# Patient Record
Sex: Male | Born: 1960 | Race: White | Hispanic: No | Smoking: Current every day smoker
Health system: Southern US, Community
[De-identification: ages and names within clinical notes are randomized; demographics above are authoritative.]

## PROBLEM LIST (undated history)

## (undated) DIAGNOSIS — I420 Dilated cardiomyopathy: Secondary | ICD-10-CM

## (undated) DIAGNOSIS — I10 Essential (primary) hypertension: Secondary | ICD-10-CM

## (undated) DIAGNOSIS — I5033 Acute on chronic diastolic (congestive) heart failure: Secondary | ICD-10-CM

## (undated) DIAGNOSIS — I472 Ventricular tachycardia, unspecified: Secondary | ICD-10-CM

## (undated) DIAGNOSIS — I4891 Unspecified atrial fibrillation: Principal | ICD-10-CM

## (undated) HISTORY — PX: PILONIDAL CYST / SINUS EXCISION: SUR543

---

## 2003-07-05 HISTORY — PX: CARDIAC CATHETERIZATION: SHX172

## 2004-02-18 ENCOUNTER — Inpatient Hospital Stay (HOSPITAL_COMMUNITY): Admission: EM | Admit: 2004-02-18 | Discharge: 2004-03-02 | Payer: Self-pay | Admitting: Emergency Medicine

## 2004-02-19 ENCOUNTER — Encounter: Payer: Self-pay | Admitting: Internal Medicine

## 2004-02-23 ENCOUNTER — Encounter: Payer: Self-pay | Admitting: Internal Medicine

## 2004-05-28 ENCOUNTER — Ambulatory Visit: Payer: Self-pay | Admitting: Internal Medicine

## 2004-06-03 ENCOUNTER — Ambulatory Visit: Payer: Self-pay | Admitting: Cardiology

## 2004-06-04 ENCOUNTER — Ambulatory Visit: Payer: Self-pay | Admitting: Cardiology

## 2004-06-14 ENCOUNTER — Ambulatory Visit: Payer: Self-pay

## 2004-06-14 ENCOUNTER — Ambulatory Visit: Payer: Self-pay | Admitting: Cardiology

## 2005-03-15 ENCOUNTER — Inpatient Hospital Stay (HOSPITAL_COMMUNITY): Admission: EM | Admit: 2005-03-15 | Discharge: 2005-03-21 | Payer: Self-pay | Admitting: *Deleted

## 2005-03-15 ENCOUNTER — Ambulatory Visit: Payer: Self-pay | Admitting: Cardiology

## 2005-03-16 ENCOUNTER — Encounter: Payer: Self-pay | Admitting: Cardiology

## 2005-03-16 ENCOUNTER — Ambulatory Visit: Payer: Self-pay | Admitting: Cardiology

## 2005-03-24 ENCOUNTER — Ambulatory Visit: Payer: Self-pay | Admitting: Cardiology

## 2005-04-07 ENCOUNTER — Ambulatory Visit: Payer: Self-pay | Admitting: Cardiology

## 2005-04-08 ENCOUNTER — Ambulatory Visit: Payer: Self-pay | Admitting: Internal Medicine

## 2005-05-02 ENCOUNTER — Ambulatory Visit: Payer: Self-pay | Admitting: Cardiology

## 2005-05-17 ENCOUNTER — Ambulatory Visit: Payer: Self-pay | Admitting: Internal Medicine

## 2005-06-07 ENCOUNTER — Ambulatory Visit: Payer: Self-pay | Admitting: Cardiology

## 2005-06-28 ENCOUNTER — Ambulatory Visit: Payer: Self-pay | Admitting: Internal Medicine

## 2005-09-12 ENCOUNTER — Ambulatory Visit: Payer: Self-pay | Admitting: Cardiology

## 2005-09-20 ENCOUNTER — Ambulatory Visit: Payer: Self-pay | Admitting: Internal Medicine

## 2006-01-16 ENCOUNTER — Ambulatory Visit: Payer: Self-pay | Admitting: Cardiology

## 2006-01-23 ENCOUNTER — Ambulatory Visit: Payer: Self-pay | Admitting: Family Medicine

## 2006-01-30 ENCOUNTER — Ambulatory Visit: Payer: Self-pay | Admitting: Cardiovascular Disease

## 2006-02-13 ENCOUNTER — Ambulatory Visit: Payer: Self-pay | Admitting: Cardiovascular Disease

## 2006-05-05 ENCOUNTER — Ambulatory Visit: Payer: Self-pay | Admitting: Cardiology

## 2007-01-26 ENCOUNTER — Telehealth (INDEPENDENT_AMBULATORY_CARE_PROVIDER_SITE_OTHER): Payer: Self-pay | Admitting: *Deleted

## 2007-01-26 ENCOUNTER — Telehealth (INDEPENDENT_AMBULATORY_CARE_PROVIDER_SITE_OTHER): Payer: Self-pay | Admitting: Family Medicine

## 2007-01-26 ENCOUNTER — Ambulatory Visit: Payer: Self-pay | Admitting: Family Medicine

## 2007-01-26 DIAGNOSIS — I4891 Unspecified atrial fibrillation: Secondary | ICD-10-CM | POA: Insufficient documentation

## 2007-01-26 DIAGNOSIS — I509 Heart failure, unspecified: Secondary | ICD-10-CM | POA: Insufficient documentation

## 2007-01-26 LAB — CONVERTED CEMR LAB
Basophils Absolute: 0 10*3/uL (ref 0.0–0.1)
Basophils Relative: 0 % (ref 0–1)
Chloride: 99 meq/L (ref 96–112)
Eosinophils Absolute: 0.2 10*3/uL (ref 0.0–0.7)
Eosinophils Relative: 2 % (ref 0–5)
Hemoglobin: 13.2 g/dL (ref 13.0–17.0)
Lymphocytes Relative: 20 % (ref 12–46)
MCHC: 33.2 g/dL (ref 30.0–36.0)
MCV: 90.7 fL (ref 78.0–100.0)
Monocytes Absolute: 0.6 10*3/uL (ref 0.2–0.7)
Neutro Abs: 6 10*3/uL (ref 1.7–7.7)
Neutrophils Relative %: 70 % (ref 43–77)
WBC: 8.6 10*3/uL (ref 4.0–10.5)

## 2007-01-29 ENCOUNTER — Telehealth (INDEPENDENT_AMBULATORY_CARE_PROVIDER_SITE_OTHER): Payer: Self-pay | Admitting: *Deleted

## 2007-02-01 ENCOUNTER — Telehealth (INDEPENDENT_AMBULATORY_CARE_PROVIDER_SITE_OTHER): Payer: Self-pay | Admitting: *Deleted

## 2007-02-02 ENCOUNTER — Telehealth (INDEPENDENT_AMBULATORY_CARE_PROVIDER_SITE_OTHER): Payer: Self-pay | Admitting: *Deleted

## 2007-02-16 ENCOUNTER — Ambulatory Visit: Payer: Self-pay | Admitting: Family Medicine

## 2007-02-19 ENCOUNTER — Encounter (INDEPENDENT_AMBULATORY_CARE_PROVIDER_SITE_OTHER): Payer: Self-pay | Admitting: Family Medicine

## 2007-02-19 ENCOUNTER — Telehealth (INDEPENDENT_AMBULATORY_CARE_PROVIDER_SITE_OTHER): Payer: Self-pay | Admitting: *Deleted

## 2007-02-19 LAB — CONVERTED CEMR LAB
CO2: 31 meq/L (ref 19–32)
Calcium: 8.8 mg/dL (ref 8.4–10.5)
Creatinine, Ser: 0.8 mg/dL (ref 0.4–1.5)
GFR calc Af Amer: 133 mL/min
GFR calc non Af Amer: 110 mL/min
Glucose, Bld: 111 mg/dL — ABNORMAL HIGH (ref 70–99)
Potassium: 4.6 meq/L (ref 3.5–5.1)
Sodium: 143 meq/L (ref 135–145)

## 2007-02-26 ENCOUNTER — Telehealth (INDEPENDENT_AMBULATORY_CARE_PROVIDER_SITE_OTHER): Payer: Self-pay | Admitting: *Deleted

## 2007-02-26 ENCOUNTER — Encounter (INDEPENDENT_AMBULATORY_CARE_PROVIDER_SITE_OTHER): Payer: Self-pay | Admitting: *Deleted

## 2007-03-07 ENCOUNTER — Encounter (INDEPENDENT_AMBULATORY_CARE_PROVIDER_SITE_OTHER): Payer: Self-pay | Admitting: Family Medicine

## 2007-03-15 ENCOUNTER — Encounter (INDEPENDENT_AMBULATORY_CARE_PROVIDER_SITE_OTHER): Payer: Self-pay | Admitting: Family Medicine

## 2007-03-28 ENCOUNTER — Ambulatory Visit: Payer: Self-pay | Admitting: Family Medicine

## 2007-03-28 DIAGNOSIS — M549 Dorsalgia, unspecified: Secondary | ICD-10-CM | POA: Insufficient documentation

## 2007-03-30 ENCOUNTER — Telehealth (INDEPENDENT_AMBULATORY_CARE_PROVIDER_SITE_OTHER): Payer: Self-pay | Admitting: *Deleted

## 2007-03-30 ENCOUNTER — Encounter (INDEPENDENT_AMBULATORY_CARE_PROVIDER_SITE_OTHER): Payer: Self-pay | Admitting: Family Medicine

## 2007-05-25 ENCOUNTER — Telehealth (INDEPENDENT_AMBULATORY_CARE_PROVIDER_SITE_OTHER): Payer: Self-pay | Admitting: *Deleted

## 2007-05-27 ENCOUNTER — Encounter (INDEPENDENT_AMBULATORY_CARE_PROVIDER_SITE_OTHER): Payer: Self-pay | Admitting: Family Medicine

## 2007-09-04 ENCOUNTER — Ambulatory Visit: Payer: Self-pay | Admitting: Internal Medicine

## 2007-09-04 DIAGNOSIS — I10 Essential (primary) hypertension: Secondary | ICD-10-CM | POA: Insufficient documentation

## 2007-09-04 DIAGNOSIS — Z8679 Personal history of other diseases of the circulatory system: Secondary | ICD-10-CM

## 2007-09-04 DIAGNOSIS — Z85828 Personal history of other malignant neoplasm of skin: Secondary | ICD-10-CM | POA: Insufficient documentation

## 2007-09-04 DIAGNOSIS — E8881 Metabolic syndrome: Secondary | ICD-10-CM | POA: Insufficient documentation

## 2007-09-06 ENCOUNTER — Encounter (INDEPENDENT_AMBULATORY_CARE_PROVIDER_SITE_OTHER): Payer: Self-pay | Admitting: *Deleted

## 2007-09-06 LAB — CONVERTED CEMR LAB
Eosinophils Relative: 5.5 % — ABNORMAL HIGH (ref 0.0–5.0)
HCT: 40.7 % (ref 39.0–52.0)
Hgb A1c MFr Bld: 6 % (ref 4.6–6.0)
Monocytes Relative: 9.1 % (ref 3.0–11.0)
RDW: 13.2 % (ref 11.5–14.6)
WBC: 8 10*3/uL (ref 4.5–10.5)

## 2007-09-18 ENCOUNTER — Telehealth (INDEPENDENT_AMBULATORY_CARE_PROVIDER_SITE_OTHER): Payer: Self-pay | Admitting: *Deleted

## 2007-10-01 ENCOUNTER — Inpatient Hospital Stay (HOSPITAL_COMMUNITY): Admission: EM | Admit: 2007-10-01 | Discharge: 2007-10-05 | Payer: Self-pay | Admitting: Emergency Medicine

## 2007-10-01 ENCOUNTER — Ambulatory Visit: Payer: Self-pay | Admitting: Cardiovascular Disease

## 2007-10-02 ENCOUNTER — Encounter: Payer: Self-pay | Admitting: Cardiovascular Disease

## 2007-10-08 ENCOUNTER — Ambulatory Visit: Payer: Self-pay | Admitting: Cardiology

## 2007-10-08 ENCOUNTER — Ambulatory Visit: Payer: Self-pay | Admitting: Cardiovascular Disease

## 2007-10-08 LAB — CONVERTED CEMR LAB
Calcium: 9.5 mg/dL (ref 8.4–10.5)
Chloride: 92 meq/L — ABNORMAL LOW (ref 96–112)
GFR calc Af Amer: 116 mL/min
GFR calc non Af Amer: 96 mL/min
Glucose, Bld: 140 mg/dL — ABNORMAL HIGH (ref 70–99)
INR: 4.5 — ABNORMAL HIGH (ref 0.8–1.0)
Potassium: 4.3 meq/L (ref 3.5–5.1)
Prothrombin Time: 44.9 s — ABNORMAL HIGH (ref 10.9–13.3)
Sodium: 133 meq/L — ABNORMAL LOW (ref 135–145)

## 2007-10-17 ENCOUNTER — Ambulatory Visit: Payer: Self-pay | Admitting: Cardiology

## 2007-10-17 LAB — CONVERTED CEMR LAB: INR: 9.5 (ref 0.8–1.0)

## 2007-10-22 ENCOUNTER — Ambulatory Visit: Payer: Self-pay | Admitting: Cardiology

## 2007-11-05 ENCOUNTER — Ambulatory Visit: Payer: Self-pay | Admitting: Internal Medicine

## 2007-11-08 ENCOUNTER — Emergency Department (HOSPITAL_COMMUNITY): Admission: EM | Admit: 2007-11-08 | Discharge: 2007-11-08 | Payer: Self-pay | Admitting: Family Medicine

## 2007-11-09 ENCOUNTER — Emergency Department (HOSPITAL_COMMUNITY): Admission: EM | Admit: 2007-11-09 | Discharge: 2007-11-10 | Payer: Self-pay | Admitting: Emergency Medicine

## 2007-11-14 ENCOUNTER — Ambulatory Visit: Payer: Self-pay | Admitting: Internal Medicine

## 2007-11-14 DIAGNOSIS — J019 Acute sinusitis, unspecified: Secondary | ICD-10-CM

## 2007-11-16 ENCOUNTER — Telehealth (INDEPENDENT_AMBULATORY_CARE_PROVIDER_SITE_OTHER): Payer: Self-pay | Admitting: *Deleted

## 2007-11-19 ENCOUNTER — Emergency Department: Payer: Self-pay | Admitting: Emergency Medicine

## 2007-12-04 ENCOUNTER — Ambulatory Visit: Payer: Self-pay | Admitting: Cardiology

## 2007-12-10 ENCOUNTER — Emergency Department (HOSPITAL_COMMUNITY): Admission: EM | Admit: 2007-12-10 | Discharge: 2007-12-10 | Payer: Self-pay | Admitting: Emergency Medicine

## 2008-01-07 ENCOUNTER — Ambulatory Visit: Payer: Self-pay | Admitting: Cardiology

## 2008-01-23 ENCOUNTER — Ambulatory Visit: Payer: Self-pay | Admitting: Cardiology

## 2008-02-09 ENCOUNTER — Emergency Department (HOSPITAL_COMMUNITY): Admission: EM | Admit: 2008-02-09 | Discharge: 2008-02-09 | Payer: Self-pay | Admitting: Emergency Medicine

## 2008-02-13 ENCOUNTER — Ambulatory Visit: Payer: Self-pay | Admitting: Cardiology

## 2008-03-07 ENCOUNTER — Ambulatory Visit: Payer: Self-pay | Admitting: Cardiology

## 2008-03-07 ENCOUNTER — Ambulatory Visit: Payer: Self-pay | Admitting: Cardiovascular Disease

## 2008-03-07 LAB — CONVERTED CEMR LAB
BUN: 14 mg/dL (ref 6–23)
Calcium: 8.6 mg/dL (ref 8.4–10.5)
Chloride: 97 meq/L (ref 96–112)
Potassium: 4.1 meq/L (ref 3.5–5.1)

## 2008-03-19 ENCOUNTER — Emergency Department (HOSPITAL_COMMUNITY): Admission: EM | Admit: 2008-03-19 | Discharge: 2008-03-19 | Payer: Self-pay | Admitting: Family Medicine

## 2008-04-02 ENCOUNTER — Ambulatory Visit: Payer: Self-pay | Admitting: Cardiology

## 2008-04-09 ENCOUNTER — Ambulatory Visit: Payer: Self-pay | Admitting: Cardiology

## 2008-04-09 ENCOUNTER — Ambulatory Visit: Payer: Self-pay | Admitting: Internal Medicine

## 2008-04-09 LAB — CONVERTED CEMR LAB
Creatinine, Ser: 0.7 mg/dL (ref 0.4–1.5)
GFR calc Af Amer: 154 mL/min
Glucose, Bld: 175 mg/dL — ABNORMAL HIGH (ref 70–99)
Potassium: 4 meq/L (ref 3.5–5.1)

## 2008-05-07 ENCOUNTER — Ambulatory Visit: Payer: Self-pay | Admitting: Cardiology

## 2008-05-07 ENCOUNTER — Ambulatory Visit: Payer: Self-pay | Admitting: Cardiovascular Disease

## 2008-05-07 LAB — CONVERTED CEMR LAB
BUN: 19 mg/dL (ref 6–23)
CO2: 34 meq/L — ABNORMAL HIGH (ref 19–32)
Chloride: 99 meq/L (ref 96–112)
Creatinine, Ser: 1 mg/dL (ref 0.4–1.5)
GFR calc Af Amer: 102 mL/min
GFR calc non Af Amer: 84 mL/min

## 2008-05-21 ENCOUNTER — Ambulatory Visit: Payer: Self-pay | Admitting: Cardiology

## 2008-06-20 ENCOUNTER — Ambulatory Visit: Payer: Self-pay | Admitting: Cardiology

## 2008-07-06 ENCOUNTER — Emergency Department (HOSPITAL_COMMUNITY): Admission: EM | Admit: 2008-07-06 | Discharge: 2008-07-06 | Payer: Self-pay | Admitting: Emergency Medicine

## 2008-07-18 ENCOUNTER — Ambulatory Visit: Payer: Self-pay | Admitting: Cardiology

## 2008-07-18 LAB — CONVERTED CEMR LAB
INR: 5.4 — ABNORMAL HIGH
Prothrombin Time: 53.3 s

## 2008-08-04 ENCOUNTER — Emergency Department (HOSPITAL_COMMUNITY): Admission: EM | Admit: 2008-08-04 | Discharge: 2008-08-04 | Payer: Self-pay | Admitting: Emergency Medicine

## 2008-08-09 ENCOUNTER — Ambulatory Visit: Payer: Self-pay | Admitting: Internal Medicine

## 2008-08-09 ENCOUNTER — Inpatient Hospital Stay (HOSPITAL_COMMUNITY): Admission: EM | Admit: 2008-08-09 | Discharge: 2008-08-25 | Payer: Self-pay | Admitting: Emergency Medicine

## 2008-08-27 ENCOUNTER — Ambulatory Visit: Payer: Self-pay | Admitting: Cardiology

## 2008-09-03 ENCOUNTER — Ambulatory Visit: Payer: Self-pay | Admitting: Internal Medicine

## 2008-09-03 ENCOUNTER — Ambulatory Visit: Payer: Self-pay | Admitting: Cardiology

## 2008-09-08 ENCOUNTER — Emergency Department (HOSPITAL_COMMUNITY): Admission: EM | Admit: 2008-09-08 | Discharge: 2008-09-08 | Payer: Self-pay | Admitting: Family Medicine

## 2008-09-15 ENCOUNTER — Ambulatory Visit: Payer: Self-pay | Admitting: Cardiology

## 2008-09-15 LAB — CONVERTED CEMR LAB: Prothrombin Time: 92.9 s (ref 10.9–13.3)

## 2008-09-17 ENCOUNTER — Ambulatory Visit: Payer: Self-pay | Admitting: Cardiology

## 2008-09-17 LAB — CONVERTED CEMR LAB: Prothrombin Time: 60.9 s (ref 10.9–13.3)

## 2008-09-20 ENCOUNTER — Emergency Department (HOSPITAL_COMMUNITY): Admission: EM | Admit: 2008-09-20 | Discharge: 2008-09-20 | Payer: Self-pay | Admitting: Emergency Medicine

## 2008-09-22 ENCOUNTER — Ambulatory Visit: Payer: Self-pay | Admitting: Cardiology

## 2008-10-16 ENCOUNTER — Ambulatory Visit: Payer: Self-pay | Admitting: Pulmonary Disease

## 2008-10-16 DIAGNOSIS — G4710 Hypersomnia, unspecified: Secondary | ICD-10-CM | POA: Insufficient documentation

## 2008-10-16 DIAGNOSIS — G4730 Sleep apnea, unspecified: Secondary | ICD-10-CM

## 2008-10-17 ENCOUNTER — Ambulatory Visit: Payer: Self-pay | Admitting: Internal Medicine

## 2008-11-07 ENCOUNTER — Ambulatory Visit: Payer: Self-pay | Admitting: Cardiovascular Disease

## 2008-11-10 ENCOUNTER — Emergency Department (HOSPITAL_BASED_OUTPATIENT_CLINIC_OR_DEPARTMENT_OTHER): Admission: EM | Admit: 2008-11-10 | Discharge: 2008-11-10 | Payer: Self-pay | Admitting: Emergency Medicine

## 2008-11-11 ENCOUNTER — Ambulatory Visit (HOSPITAL_BASED_OUTPATIENT_CLINIC_OR_DEPARTMENT_OTHER): Admission: RE | Admit: 2008-11-11 | Discharge: 2008-11-11 | Payer: Self-pay | Admitting: Pulmonary Disease

## 2008-11-11 ENCOUNTER — Encounter: Payer: Self-pay | Admitting: Pulmonary Disease

## 2008-11-14 ENCOUNTER — Ambulatory Visit: Payer: Self-pay | Admitting: Pulmonary Disease

## 2008-11-26 ENCOUNTER — Encounter: Payer: Self-pay | Admitting: Pulmonary Disease

## 2008-12-03 ENCOUNTER — Encounter: Payer: Self-pay | Admitting: *Deleted

## 2008-12-08 ENCOUNTER — Ambulatory Visit: Payer: Self-pay | Admitting: Cardiology

## 2008-12-08 ENCOUNTER — Ambulatory Visit: Payer: Self-pay | Admitting: Cardiovascular Disease

## 2008-12-17 ENCOUNTER — Encounter: Payer: Self-pay | Admitting: Pulmonary Disease

## 2009-01-01 ENCOUNTER — Telehealth: Payer: Self-pay | Admitting: Cardiology

## 2009-01-07 ENCOUNTER — Encounter: Payer: Self-pay | Admitting: *Deleted

## 2009-01-27 ENCOUNTER — Telehealth (INDEPENDENT_AMBULATORY_CARE_PROVIDER_SITE_OTHER): Payer: Self-pay | Admitting: *Deleted

## 2009-01-30 ENCOUNTER — Encounter: Payer: Self-pay | Admitting: Pulmonary Disease

## 2009-02-08 ENCOUNTER — Encounter: Payer: Self-pay | Admitting: Pulmonary Disease

## 2009-02-09 ENCOUNTER — Ambulatory Visit: Payer: Self-pay | Admitting: Pulmonary Disease

## 2009-02-10 ENCOUNTER — Inpatient Hospital Stay (HOSPITAL_COMMUNITY): Admission: EM | Admit: 2009-02-10 | Discharge: 2009-02-13 | Payer: Self-pay | Admitting: Emergency Medicine

## 2009-02-10 ENCOUNTER — Encounter: Payer: Self-pay | Admitting: Pulmonary Disease

## 2009-02-10 ENCOUNTER — Encounter: Payer: Self-pay | Admitting: Cardiology

## 2009-02-10 ENCOUNTER — Ambulatory Visit: Payer: Self-pay | Admitting: Cardiology

## 2009-02-11 ENCOUNTER — Encounter: Payer: Self-pay | Admitting: Cardiology

## 2009-02-12 ENCOUNTER — Encounter: Payer: Self-pay | Admitting: Pulmonary Disease

## 2009-02-13 ENCOUNTER — Encounter: Payer: Self-pay | Admitting: Cardiology

## 2009-02-16 ENCOUNTER — Encounter (INDEPENDENT_AMBULATORY_CARE_PROVIDER_SITE_OTHER): Payer: Self-pay | Admitting: *Deleted

## 2009-02-23 ENCOUNTER — Encounter: Payer: Self-pay | Admitting: Internal Medicine

## 2009-02-24 ENCOUNTER — Encounter: Payer: Self-pay | Admitting: Internal Medicine

## 2009-02-25 ENCOUNTER — Telehealth (INDEPENDENT_AMBULATORY_CARE_PROVIDER_SITE_OTHER): Payer: Self-pay | Admitting: *Deleted

## 2009-02-25 ENCOUNTER — Telehealth: Payer: Self-pay | Admitting: Internal Medicine

## 2009-03-02 ENCOUNTER — Ambulatory Visit: Payer: Self-pay | Admitting: Internal Medicine

## 2009-03-02 ENCOUNTER — Ambulatory Visit: Payer: Self-pay | Admitting: Cardiovascular Disease

## 2009-03-02 DIAGNOSIS — I472 Ventricular tachycardia: Secondary | ICD-10-CM

## 2009-03-20 ENCOUNTER — Ambulatory Visit: Payer: Self-pay | Admitting: Internal Medicine

## 2009-03-25 ENCOUNTER — Encounter (INDEPENDENT_AMBULATORY_CARE_PROVIDER_SITE_OTHER): Payer: Self-pay | Admitting: *Deleted

## 2009-04-02 ENCOUNTER — Ambulatory Visit: Payer: Self-pay | Admitting: Internal Medicine

## 2009-04-02 LAB — CONVERTED CEMR LAB: POC INR: 2.8

## 2009-04-03 ENCOUNTER — Encounter: Payer: Self-pay | Admitting: Pulmonary Disease

## 2009-04-07 ENCOUNTER — Encounter: Payer: Self-pay | Admitting: Pulmonary Disease

## 2009-04-14 ENCOUNTER — Encounter: Payer: Self-pay | Admitting: Internal Medicine

## 2009-04-22 ENCOUNTER — Encounter (INDEPENDENT_AMBULATORY_CARE_PROVIDER_SITE_OTHER): Payer: Self-pay | Admitting: *Deleted

## 2009-04-23 ENCOUNTER — Telehealth: Payer: Self-pay | Admitting: Pulmonary Disease

## 2009-04-24 ENCOUNTER — Encounter: Payer: Self-pay | Admitting: Pulmonary Disease

## 2009-04-29 ENCOUNTER — Encounter: Payer: Self-pay | Admitting: Pulmonary Disease

## 2009-04-30 ENCOUNTER — Encounter: Payer: Self-pay | Admitting: Pulmonary Disease

## 2009-05-05 ENCOUNTER — Encounter: Payer: Self-pay | Admitting: Internal Medicine

## 2009-05-17 ENCOUNTER — Encounter: Payer: Self-pay | Admitting: Pulmonary Disease

## 2009-05-18 ENCOUNTER — Ambulatory Visit: Payer: Self-pay | Admitting: Pulmonary Disease

## 2009-06-10 ENCOUNTER — Ambulatory Visit: Payer: Self-pay | Admitting: Cardiology

## 2009-06-10 LAB — CONVERTED CEMR LAB: POC INR: 1.9

## 2009-06-17 ENCOUNTER — Ambulatory Visit: Payer: Self-pay | Admitting: Cardiology

## 2009-06-17 LAB — CONVERTED CEMR LAB: POC INR: 2.3

## 2009-06-30 ENCOUNTER — Ambulatory Visit: Payer: Self-pay | Admitting: Cardiology

## 2009-07-05 ENCOUNTER — Ambulatory Visit: Payer: Self-pay | Admitting: Internal Medicine

## 2009-07-05 ENCOUNTER — Encounter: Payer: Self-pay | Admitting: Emergency Medicine

## 2009-07-05 ENCOUNTER — Inpatient Hospital Stay (HOSPITAL_COMMUNITY): Admission: EM | Admit: 2009-07-05 | Discharge: 2009-07-09 | Payer: Self-pay | Admitting: Cardiology

## 2009-07-05 ENCOUNTER — Ambulatory Visit: Payer: Self-pay | Admitting: Diagnostic Radiology

## 2009-07-08 ENCOUNTER — Encounter: Payer: Self-pay | Admitting: Pulmonary Disease

## 2009-07-13 ENCOUNTER — Encounter (INDEPENDENT_AMBULATORY_CARE_PROVIDER_SITE_OTHER): Payer: Self-pay | Admitting: Cardiology

## 2009-07-13 ENCOUNTER — Ambulatory Visit: Payer: Self-pay | Admitting: Internal Medicine

## 2009-07-31 ENCOUNTER — Encounter: Payer: Self-pay | Admitting: Pulmonary Disease

## 2009-08-05 ENCOUNTER — Ambulatory Visit: Payer: Self-pay | Admitting: Internal Medicine

## 2009-08-05 ENCOUNTER — Encounter (INDEPENDENT_AMBULATORY_CARE_PROVIDER_SITE_OTHER): Payer: Self-pay | Admitting: Cardiology

## 2009-08-05 LAB — CONVERTED CEMR LAB
INR: 1.9
POC INR: 1.9

## 2009-08-12 ENCOUNTER — Encounter: Payer: Self-pay | Admitting: Pulmonary Disease

## 2009-08-21 ENCOUNTER — Emergency Department (HOSPITAL_BASED_OUTPATIENT_CLINIC_OR_DEPARTMENT_OTHER): Admission: EM | Admit: 2009-08-21 | Discharge: 2009-08-21 | Payer: Self-pay | Admitting: Emergency Medicine

## 2009-08-25 ENCOUNTER — Encounter (INDEPENDENT_AMBULATORY_CARE_PROVIDER_SITE_OTHER): Payer: Self-pay | Admitting: *Deleted

## 2009-09-19 ENCOUNTER — Emergency Department (HOSPITAL_BASED_OUTPATIENT_CLINIC_OR_DEPARTMENT_OTHER): Admission: EM | Admit: 2009-09-19 | Discharge: 2009-09-19 | Payer: Self-pay | Admitting: Emergency Medicine

## 2009-09-22 ENCOUNTER — Ambulatory Visit: Payer: Self-pay | Admitting: Cardiovascular Disease

## 2009-10-06 ENCOUNTER — Ambulatory Visit: Payer: Self-pay | Admitting: Internal Medicine

## 2009-10-18 ENCOUNTER — Emergency Department (HOSPITAL_BASED_OUTPATIENT_CLINIC_OR_DEPARTMENT_OTHER): Admission: EM | Admit: 2009-10-18 | Discharge: 2009-10-18 | Payer: Self-pay | Admitting: Emergency Medicine

## 2009-10-19 ENCOUNTER — Ambulatory Visit: Payer: Self-pay | Admitting: Cardiology

## 2009-10-19 LAB — CONVERTED CEMR LAB: POC INR: 1.7

## 2009-10-21 ENCOUNTER — Encounter: Payer: Self-pay | Admitting: Pulmonary Disease

## 2009-10-22 ENCOUNTER — Ambulatory Visit: Payer: Self-pay | Admitting: Pulmonary Disease

## 2009-11-04 ENCOUNTER — Ambulatory Visit: Payer: Self-pay | Admitting: Cardiology

## 2009-11-04 ENCOUNTER — Encounter: Payer: Self-pay | Admitting: Internal Medicine

## 2009-11-04 LAB — CONVERTED CEMR LAB: POC INR: 2.3

## 2009-11-11 ENCOUNTER — Encounter: Payer: Self-pay | Admitting: Internal Medicine

## 2009-11-12 ENCOUNTER — Encounter: Payer: Self-pay | Admitting: Internal Medicine

## 2009-11-12 ENCOUNTER — Ambulatory Visit: Payer: Self-pay | Admitting: Internal Medicine

## 2009-12-02 ENCOUNTER — Telehealth: Payer: Self-pay | Admitting: Internal Medicine

## 2009-12-11 ENCOUNTER — Ambulatory Visit: Payer: Self-pay | Admitting: Cardiology

## 2009-12-11 LAB — CONVERTED CEMR LAB: POC INR: 1.4

## 2009-12-18 ENCOUNTER — Ambulatory Visit: Payer: Self-pay | Admitting: Cardiology

## 2009-12-18 LAB — CONVERTED CEMR LAB: POC INR: 3.6

## 2009-12-29 ENCOUNTER — Emergency Department (HOSPITAL_BASED_OUTPATIENT_CLINIC_OR_DEPARTMENT_OTHER): Admission: EM | Admit: 2009-12-29 | Discharge: 2009-12-29 | Payer: Self-pay | Admitting: Emergency Medicine

## 2009-12-31 ENCOUNTER — Emergency Department (HOSPITAL_BASED_OUTPATIENT_CLINIC_OR_DEPARTMENT_OTHER): Admission: EM | Admit: 2009-12-31 | Discharge: 2009-12-31 | Payer: Self-pay | Admitting: Emergency Medicine

## 2010-01-01 ENCOUNTER — Ambulatory Visit: Payer: Self-pay | Admitting: Cardiology

## 2010-01-01 LAB — CONVERTED CEMR LAB: POC INR: 4

## 2010-01-11 ENCOUNTER — Emergency Department (HOSPITAL_BASED_OUTPATIENT_CLINIC_OR_DEPARTMENT_OTHER): Admission: EM | Admit: 2010-01-11 | Discharge: 2010-01-12 | Payer: Self-pay | Admitting: Emergency Medicine

## 2010-01-11 ENCOUNTER — Ambulatory Visit: Payer: Self-pay | Admitting: Diagnostic Radiology

## 2010-01-27 ENCOUNTER — Emergency Department (HOSPITAL_BASED_OUTPATIENT_CLINIC_OR_DEPARTMENT_OTHER): Admission: EM | Admit: 2010-01-27 | Discharge: 2010-01-27 | Payer: Self-pay | Admitting: Emergency Medicine

## 2010-02-02 ENCOUNTER — Ambulatory Visit (HOSPITAL_BASED_OUTPATIENT_CLINIC_OR_DEPARTMENT_OTHER): Admission: RE | Admit: 2010-02-02 | Discharge: 2010-02-02 | Payer: Self-pay | Admitting: Internal Medicine

## 2010-02-02 ENCOUNTER — Ambulatory Visit: Payer: Self-pay | Admitting: Radiology

## 2010-02-02 ENCOUNTER — Ambulatory Visit: Payer: Self-pay | Admitting: Family

## 2010-02-02 DIAGNOSIS — R7309 Other abnormal glucose: Secondary | ICD-10-CM | POA: Insufficient documentation

## 2010-02-03 ENCOUNTER — Encounter: Payer: Self-pay | Admitting: Family

## 2010-02-07 ENCOUNTER — Emergency Department (HOSPITAL_BASED_OUTPATIENT_CLINIC_OR_DEPARTMENT_OTHER): Admission: EM | Admit: 2010-02-07 | Discharge: 2010-02-07 | Payer: Self-pay | Admitting: Emergency Medicine

## 2010-02-08 ENCOUNTER — Ambulatory Visit: Payer: Self-pay | Admitting: Cardiovascular Disease

## 2010-02-08 ENCOUNTER — Encounter: Payer: Self-pay | Admitting: Internal Medicine

## 2010-02-08 ENCOUNTER — Ambulatory Visit: Payer: Self-pay | Admitting: Internal Medicine

## 2010-02-08 ENCOUNTER — Telehealth: Payer: Self-pay | Admitting: Family

## 2010-02-09 ENCOUNTER — Encounter: Admission: RE | Admit: 2010-02-09 | Discharge: 2010-03-17 | Payer: Self-pay | Admitting: Family

## 2010-02-09 ENCOUNTER — Telehealth: Payer: Self-pay | Admitting: Family

## 2010-02-10 ENCOUNTER — Encounter: Payer: Self-pay | Admitting: Family

## 2010-02-19 ENCOUNTER — Telehealth: Payer: Self-pay | Admitting: Family

## 2010-02-19 DIAGNOSIS — M545 Low back pain: Secondary | ICD-10-CM | POA: Insufficient documentation

## 2010-02-20 ENCOUNTER — Telehealth: Payer: Self-pay | Admitting: Family

## 2010-02-20 ENCOUNTER — Ambulatory Visit: Payer: Self-pay | Admitting: Diagnostic Radiology

## 2010-02-20 ENCOUNTER — Ambulatory Visit (HOSPITAL_BASED_OUTPATIENT_CLINIC_OR_DEPARTMENT_OTHER): Admission: RE | Admit: 2010-02-20 | Discharge: 2010-02-20 | Payer: Self-pay | Admitting: Internal Medicine

## 2010-02-24 ENCOUNTER — Ambulatory Visit: Payer: Self-pay | Admitting: Cardiology

## 2010-02-24 ENCOUNTER — Encounter: Payer: Self-pay | Admitting: Family

## 2010-02-24 ENCOUNTER — Ambulatory Visit (HOSPITAL_COMMUNITY): Admission: RE | Admit: 2010-02-24 | Discharge: 2010-02-24 | Payer: Self-pay | Admitting: Cardiovascular Disease

## 2010-02-24 ENCOUNTER — Ambulatory Visit: Payer: Self-pay

## 2010-02-24 ENCOUNTER — Ambulatory Visit: Payer: Self-pay | Admitting: Internal Medicine

## 2010-02-24 LAB — CONVERTED CEMR LAB: POC INR: 5

## 2010-03-02 ENCOUNTER — Encounter: Payer: Self-pay | Admitting: Family

## 2010-03-10 ENCOUNTER — Ambulatory Visit: Payer: Self-pay | Admitting: Cardiovascular Disease

## 2010-03-10 LAB — CONVERTED CEMR LAB: POC INR: 2.4

## 2010-03-15 ENCOUNTER — Ambulatory Visit: Payer: Self-pay | Admitting: Family

## 2010-03-18 ENCOUNTER — Encounter (INDEPENDENT_AMBULATORY_CARE_PROVIDER_SITE_OTHER): Payer: Self-pay | Admitting: *Deleted

## 2010-03-26 ENCOUNTER — Ambulatory Visit: Payer: Self-pay | Admitting: Internal Medicine

## 2010-03-28 ENCOUNTER — Emergency Department (HOSPITAL_BASED_OUTPATIENT_CLINIC_OR_DEPARTMENT_OTHER): Admission: EM | Admit: 2010-03-28 | Discharge: 2010-03-28 | Payer: Self-pay | Admitting: Emergency Medicine

## 2010-04-05 ENCOUNTER — Ambulatory Visit: Payer: Self-pay | Admitting: Family

## 2010-04-09 ENCOUNTER — Ambulatory Visit: Payer: Self-pay | Admitting: Cardiology

## 2010-04-09 LAB — CONVERTED CEMR LAB
INR: 6.7
INR: 6.7 (ref 0.8–1.0)

## 2010-05-10 ENCOUNTER — Ambulatory Visit: Payer: Self-pay | Admitting: Internal Medicine

## 2010-05-10 ENCOUNTER — Encounter: Payer: Self-pay | Admitting: Internal Medicine

## 2010-05-10 LAB — CONVERTED CEMR LAB: POC INR: 1.6

## 2010-05-11 ENCOUNTER — Encounter: Payer: Self-pay | Admitting: Family

## 2010-06-02 ENCOUNTER — Encounter: Payer: Self-pay | Admitting: Family

## 2010-06-03 ENCOUNTER — Encounter: Payer: Self-pay | Admitting: Pulmonary Disease

## 2010-06-21 ENCOUNTER — Telehealth (INDEPENDENT_AMBULATORY_CARE_PROVIDER_SITE_OTHER): Payer: Self-pay | Admitting: *Deleted

## 2010-07-13 ENCOUNTER — Encounter: Payer: Self-pay | Admitting: Family

## 2010-07-19 ENCOUNTER — Telehealth: Payer: Self-pay | Admitting: Family

## 2010-07-20 ENCOUNTER — Encounter: Payer: Self-pay | Admitting: Family

## 2010-07-27 ENCOUNTER — Encounter: Payer: Self-pay | Admitting: Pulmonary Disease

## 2010-07-28 ENCOUNTER — Encounter (INDEPENDENT_AMBULATORY_CARE_PROVIDER_SITE_OTHER): Payer: Self-pay | Admitting: Pharmacist

## 2010-07-28 ENCOUNTER — Ambulatory Visit: Admit: 2010-07-28 | Payer: Self-pay | Admitting: Family

## 2010-07-28 DIAGNOSIS — Z0289 Encounter for other administrative examinations: Secondary | ICD-10-CM

## 2010-08-01 ENCOUNTER — Emergency Department (INDEPENDENT_AMBULATORY_CARE_PROVIDER_SITE_OTHER)
Admission: EM | Admit: 2010-08-01 | Discharge: 2010-08-01 | Payer: Self-pay | Source: Home / Self Care | Admitting: Emergency Medicine

## 2010-08-01 DIAGNOSIS — M7989 Other specified soft tissue disorders: Secondary | ICD-10-CM

## 2010-08-01 DIAGNOSIS — M7730 Calcaneal spur, unspecified foot: Secondary | ICD-10-CM

## 2010-08-01 DIAGNOSIS — M25579 Pain in unspecified ankle and joints of unspecified foot: Secondary | ICD-10-CM

## 2010-08-01 LAB — CONVERTED CEMR LAB
ALT: 17 units/L (ref 0–53)
AST: 19 units/L (ref 0–37)
Albumin: 3.4 g/dL — ABNORMAL LOW (ref 3.5–5.2)
BUN: 16 mg/dL (ref 6–23)
CO2: 30 meq/L (ref 19–32)
Calcium: 9.5 mg/dL (ref 8.4–10.5)
Creatinine, Ser: 0.9 mg/dL (ref 0.4–1.5)
GFR calc non Af Amer: 98.21 mL/min (ref >60)
Glucose, Bld: 83 mg/dL (ref 70–99)
Sodium: 142 meq/L (ref 135–145)
Total Protein: 7 g/dL (ref 6.0–8.3)

## 2010-08-04 NOTE — Assessment & Plan Note (Signed)
Summary: rov/jd   Copy to:  Juanito Doom MD Primary Provider/Referring Provider:  Dr. Mikeal Hawthorne  CC:  Pt is here for a 6 month f/u appt., wears cpap mask every night depending on how many hours sleep, and pt states fallen asleep has became a challenge.  History of Present Illness: 50 y.o morbidly obese man with systolic heart failure & A fibn  for FU of severe obstructive sleep apnea .  11/13/08 >>severe obstructive sleep apnea on study ,  (AHI 34/h, will start on CPAP 13 cm, medium full face mask + 1 LO2)  02/06/09  Sleeping better , mask Ok, pressure ok. Download  on 13 cm  >> 5/17 - 7/30 >> asked to improve compliance FU download 8/8 >>> better compliance, no leak, residual AHI  56/h! has actually lost 20 lbs since study.  May 18, 2009 3:28 PM  Questions need for O2, not using it & does not feel any different, expensive. Reviewed ONO on CPAP off O2 >> 12 mins below 88%. Reviewed downlaods, acceptable compliance on 13 cm with AHi much decreased.   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    Vital Signs:  Patient profile:   50 year old male Height:      68 inches Weight:      401.50 pounds BMI:     61.27 O2 Sat:      96 % on Room air Temp:     97.7 degrees F oral Pulse rate:   68 / minute BP sitting:   102 / 60  (left arm) Cuff size:   large  Vitals Entered By: Mindy Silva(October 22, 2009 2:16 PM)  O2 Flow:  Room air CC: Pt is here for a 6 month f/u appt., wears cpap mask every night depending on how many hours sleep, pt states fallen asleep has became a challenge Comments meds updated   Physical Exam  Additional Exam:  Gen. Pleasant, well-nourished, in no distress, normal affect, morbidly obese. ENT - no lesions, no post nasal drip, class 3 airway Neck: No JVD, no thyromegaly, no carotid bruits Lungs: no use of accessory muscles, no dullness to percussion, clear without rales or rhonchi  Cardiovascular: Rhythm regular, heart sounds  normal, no murmurs or gallops, no peripheral edema Musculoskeletal: No deformities, no cyanosis or clubbing      Impression & Recommendations:  Problem # 1:  HYPERSOMNIA, ASSOCIATED WITH SLEEP APNEA (ICD-780.53)  Compliance encouraged, wt loss emphasized, asked to avoid meds with sedative side effects, cautioned against driving when sleepy. Maintaining nSR  -  benefits of CPAP discussed. Use melatonin 1 hr prior to bedtime - avoid sedatives.  Orders: Est. Patient Level III ([address])  Medications Added to Medication List This Visit: 1)  Cpap 13 Cm   Patient Instructions: 1)  Please schedule a follow-up appointment in 7-8 months. 2)  Keep using your CPAP machine 3)  Trial of MELATONIN upto 5 mg 1 hr before bedtime

## 2010-08-04 NOTE — Miscellaneous (Signed)
  Clinical Lists Changes  Medications: Rx of WARFARIN SODIUM 5 MG  TABS (WARFARIN SODIUM) as directed;  #45 x 3;  Signed;  Entered by: Eda Keys;  Authorized by: Hillis Range, MD;  Method used: Electronically to [facility] [address], [place], [place], [place]  [address], Ph: [phone] or [phone], Fax: [phone]    Prescriptions: WARFARIN SODIUM 5 MG  TABS (WARFARIN SODIUM) as directed  #45 x 3   Entered by:   Eda Keys   Authorized by:   Hillis Range, MD   Signed by:   Eda Keys on 11/04/2009   Method used:   Electronically to        [facility] [address] (retail)       [address] [place], [place]  [address]       Ph: [phone] or [phone]       Fax: [phone]   RxID:   [id number]

## 2010-08-04 NOTE — Assessment & Plan Note (Signed)
Summary: new to est/mhf--Rm 4   Vital Signs:  Patient profile:   50 year old male Height:      68 inches Weight:      387 pounds BMI:     59.06 Temp:     98.7 degrees F oral Pulse rate:   60 / minute Pulse rhythm:   regular Resp:     18 per minute BP sitting:   110 / 68  (right arm) Cuff size:   thigh  Vitals Entered By: [name] CMA Duncan Dull) (February 02, 2010 1:31 PM) CC: Room 5  New pt to establish care. Has low back pain x 1 year and is worsening. Is Patient Diabetic? No Comments Pt states he completed Tramadol. Pradaxa was stopped due to cost. Metoprolol is 1 two times a day instead of 2 two times a day per pt.  Nicki Guadalajara Fergerson CMA Duncan Dull)  February 02, 2010 1:44 PM    Primary Care [facility]:  Lemont Fillers FNP  CC:  Room 5  New pt to establish care. Has low back pain x 1 year and is worsening.Marland Kitchen  History of Present Illness: [name] is a 50 year old male who presents to establish care. He has been followed by Dr. Marga Melnick in the past- last visit 2009, but wishes to switch his care to the [facility] location.   1) Back pain- Patient reports long standing history of back pain.  Was seen last week in the ED [facility]).  Notes that he has had some mid back pain/spasms- radiates to sides. He was give rx for Norco and Robaxin which both made him feel "loopy."   No radiating pain down his legs.  Denies lower extremity weakness.  Pain is worsened by walking.  Tramadol helps the pain- "makes it bearable." Reports pain is 7/10 today at rest.  2) Sleep apnea- CPAP setting 13.   Wakes up rested.  3)  HTN-  watches his sodium.  Denies LE edema.    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 Resp: Denies cough CV:  Denies Chest Pain or sob GI:  Denies nausea or vomitting GU: Denies dysuria Lymphatic: Denies lymphadenopathy Musculoskeletal:  + low back pain,  nots that he does develop pain in left ankle- none now Skin:  Denies Rashes or lesions Psychiatric: Denies depression or anxiety Neuro: Denies numbness     Physical Exam  General:  Morbidly obese white male in NAD Head:  Normocephalic and atraumatic without obvious abnormalities. No apparent alopecia or balding. Lungs:  Normal respiratory effort, chest expands symmetrically. Lungs are clear to auscultation, no crackles or wheezes. Heart:  Normal rate and regular rhythm. S1 and S2 normal without gallop, murmur, click, rub or other extra sounds. Abdomen:  Protuberant, but soft, non-tender Msk:  no joint swelling and no  joint warmth.   Extremities:  chronic venous stasis changes in bilateral lower extremities.1+ left pedal edema and 1+ right pedal edema.   Neurologic:  Bilateral patellar reflexes are slightly hyper-reflexive. alert & oriented X3 and gait normal.      Detailed Back/Spine Exam  Gait:    Normal heel-toe gait pattern bilaterally.    Inspection:    plantigrade foot with normal alignment of leg, ankle, hindfoot, and foot.   Thoracic Exam:  Inspection-deformity:    Normal Palpation-spinal tenderness:     mild tenderness to palpation lateral to spine (left and right)   Impression & Recommendations:  Problem # 1:  BACK PAIN (ICD-724.5) Assessment Deteriorated Will check plain films and plan referral for PT.  Plan  tylenol and tramadol as needed pain.  Reviewed old lumbar film from 2/10 which revealed DDD and spondylosis.  Strongly recommended weight loss as this is likely a major contributing factor to his pain.  If no improvement with these measures- will plan MRI and referral to neurosurgery. The following medications were removed from the medication list:    Tramadol Hcl 50 Mg Tabs (Tramadol hcl) .Marland Kitchen... 1 -2 q 6-8 hrs as needed pain His updated medication list for this problem includes:    Tramadol Hcl 50 Mg Tabs (Tramadol hcl) ..... One to two tablets every 8 hours as needed for breakthrough pain not relieved by tylenol  Orders: Lumbar Spine 2 Views (72100TC) T-DG Thoracic Spine 2 Views (13086) Physical Therapy Referral (PT)  Problem # 2:  HYPERGLYCEMIA (ICD-790.29) Assessment: Comment Only Noted that he had a ? random sugar of 175 back in 2009- will check A1C.  Pt's mom and dad were diabetic.  Orders: T-Hgb A1C ([id number])  Problem # 3:  MORBID OBESITY (ICD-278.01) Assessment: Comment Only Patient counseled on the importance of weight loss.   Ht: 68 (02/02/2010)   Wt: 387 (02/02/2010)   BMI: 59.06 (02/02/2010)  Problem # 4:  ATRIAL FIBRILLATION, HX OF (ICD-V12.59) Assessment: Unchanged Patient is following with [facility]/Coumadin clinic. Rate stable.  Complete Medication List: 1)  Metoprolol Tartrate 100 Mg Tabs (Metoprolol tartrate) .... One tablet by mouth two times a day 2)  Lisinopril 10 Mg Tabs (Lisinopril) .... Take one tablet twice daily 3)  Furosemide 80 Mg Tabs (Furosemide) .... Take 1 1/2 tablets in the am and one in the pm as directed 4)  Diltiazem Hcl Er Beads 360 Mg Xr24h-cap (Diltiazem hcl er beads) .... One by mouth once daily 5)  Amiodarone Hcl 200 Mg Tabs (Amiodarone hcl) .... Take one tablet by mouth once daily 6)  Cpap 13 Cm  7)  Warfarin Sodium 5 Mg Tabs (Warfarin sodium) .... Take 1 tablet by mouth once a day 8)  Tramadol Hcl 50 Mg Tabs (Tramadol hcl) ....  One to two tablets every 8 hours as needed for breakthrough pain not relieved by tylenol  Patient Instructions: 1)  Please complete your x-ray and blood work downstairs. 2)  Take 650-1000mg  of Tylenol every 4-6 hours as needed for relief of pain  AVOID taking more than 4000mg   in a 24 hour period (can cause liver damage in higher doses). 3)  If no relief with tylenol, you can use tramadol as needed for breakthrough pain. 4)  Follow up in 1 month- come fasting. We will do your physical. Prescriptions: TRAMADOL HCL 50 MG TABS (TRAMADOL HCL) one to two tablets every 8 hours as needed for breakthrough pain not relieved by tylenol  #45 x 0  Entered and Authorized by:   Lemont Fillers FNP   Signed by:   Lemont Fillers FNP on 02/02/2010   Method used:   Electronically to        [facility]* (retail)       [address] [place]       [place], [place]  [address]       Ph: [phone] or [phone]       Fax: [phone]   RxID:   [id number]    Preventive Care Screening  Last Tetanus Booster:    Date:  02/02/2008    Results:  Historical   Colonoscopy:    Date:  10/03/2003    Results:  normal     Immunization History:  Influenza Immunization History:    Influenza:  historical (05/04/2009)  Pneumovax Immunization History:    Pneumovax:  historical (03/04/2009)   Current Allergies (reviewed today): No known allergies

## 2010-08-04 NOTE — Letter (Signed)
Summary: CMN for PAP Device/[facility]  CMN for PAP Device/[facility]   Imported By: Sherian Rein 06/07/2010 14:48:14  _____________________________________________________________________  External Attachment:    Type:   Image     Comment:   External Document

## 2010-08-04 NOTE — Miscellaneous (Signed)
Summary: Orders Update  Clinical Lists Changes  Orders: Added new Referral order of Echocardiogram (Echo) - Signed 

## 2010-08-04 NOTE — Consult Note (Signed)
Summary: [facility]  [facility]   Imported By: Lanelle Bal 06/02/2010 11:19:24  _____________________________________________________________________  External Attachment:    Type:   Image     Comment:   External Document

## 2010-08-04 NOTE — Progress Notes (Signed)
Summary: pain med  Phone Note Call from Patient Call back at [phone]   Caller: Patient Summary of Call: Received call from pt stating that OTC NSAIDS do not help his pain. Tramadol has been helping and he is wanting to know if he can continue med on a daily basis or is there another alternative?  Nicki Guadalajara Fergerson CMA Duncan Dull)  February 09, 2010 1:51 PM   Follow-up for Phone Call        OK to take on a daily basis short term.  He can take tylenol daily as needed, but should avoid OTC nsaids (motrin, aleve etc.) since he is on coumadin and these can increase his risk of bleeding ulcer.   He should plan to start Physical Therapy.  If PT does not help his pain, then I will plan to complete further imaging (MRI) and consider referral to pain management for long term care of his back pain.  We can see how he is doing at his f/u apt at the end of this month. Thanks Follow-up by: Lemont Fillers FNP,  February 09, 2010 2:06 PM  Additional Follow-up for Phone Call Additional follow up Details #1::        Advised pt per [facility] instructions. Pt voices understanding.  States that he started PT today.  Will wait and see how PT helps his back before considering referral to pain management.  Nicki Guadalajara Fergerson CMA Duncan Dull)  February 09, 2010 2:40 PM

## 2010-08-04 NOTE — Letter (Signed)
Summary: [place] No Show Letter  [place] [facility]  [address]   [place], [place] [address]   Phone: [phone]  Fax: [phone]    03/18/2010 MRN: [id number]  [name] [address] [place], [place]  [address]   Dear Mr. RINDFLEISCH,   Our records indicate that you missed your scheduled appointment with Sandford Craze  on 03-17-2010.  Please contact this office to reschedule your appointment as soon as possible.  It is important that you keep your scheduled appointments with your physician, so we can provide you the best care possible.  Please be advised that there may be a charge for "no show" appointments.    Sincerely,   [place] [facility]

## 2010-08-04 NOTE — Assessment & Plan Note (Signed)
Summary: per check out/sf   Visit Type:  Follow-up Referring Provider:  Juanito Doom MD Primary Provider:  Dr. Mikeal Hawthorne   History of Present Illness: The patient presents today for routine electrophysiology followup. He reports doing very well since last being seen in our clinic. The patient denies symptoms of palpitations, chest pain, shortness of breath, orthopnea, PND, lower extremity edema, dizziness, presyncope, syncope, or neurologic sequela. The patient is tolerating medications without difficulties and is otherwise without complaint today.   Current Medications (verified): 1)  Metoprolol Tartrate 100 Mg Tabs (Metoprolol Tartrate) .... 2 Tabs Twice Daily 2)  Lisinopril 10 Mg  Tabs (Lisinopril) .... Take One Tablet Twice Daily 3)  Furosemide 80 Mg Tabs (Furosemide) .... Take 1 1/2 Tablets in The Am and One in The Pm As Directed 4)  Warfarin Sodium 5 Mg  Tabs (Warfarin Sodium) .... As Directed 5)  Tramadol Hcl 50 Mg  Tabs (Tramadol Hcl) .Marland Kitchen.. 1 -2 Q 6-8 Hrs As Needed Pain 6)  Diltiazem Hcl Er Beads 360 Mg Xr24h-Cap (Diltiazem Hcl Er Beads) .... One By Mouth Once Daily 7)  Amiodarone Hcl 200 Mg Tabs (Amiodarone Hcl) .... Take One Tablet By Mouth Once Daily 8)  Cpap 13 Cm  Allergies (verified): No Known Drug Allergies  Past History:  Past Medical History: Reviewed history from 03/02/2009 and no changes required. 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 Abscess L axilla  Social History: Reviewed history from 03/02/2009 and no changes required. 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 Nose:  no deformity, discharge, inflammation, or lesions Mouth:  Teeth, gums and palate normal. Oral mucosa normal. Neck:  Neck supple, no JVD. No masses, thyromegaly or abnormal cervical nodes. Lungs:  Clear bilaterally to auscultation and percussion. Heart:  Non-displaced PMI, chest non-tender; regular rate and rhythm, S1, S2 without murmurs, rubs or gallops. Carotid upstroke normal, no bruit. Normal abdominal aortic size, no bruits. Femorals normal pulses, no bruits. Pedals normal pulses. No edema, no varicosities. Abdomen:  Bowel sounds positive; abdomen soft and non-tender without masses, organomegaly, or hernias noted. No hepatosplenomegaly. Msk:  Back normal, normal gait. Muscle strength and tone normal. Pulses:  pulses normal in all 4 extremities Extremities:  No clubbing or cyanosis. Neurologic:  Alert and oriented x 3.   EKG  Procedure date:  11/12/2009  Findings:      sinus rhythm 57 bpm, PR 218,  Qt 470,  LAD  Impression & Recommendations:  Problem # 1:  ATRIAL FIBRILLATION, HX OF (ICD-V12.59) Mr Mikles has been able to maintain sinus rhythm on amiodarone since January.  He feels remarkably better.  We will continue amiodarone.  He will need LFTs and TFTs.  He has had difficulty maintaining a therapeutic inr.  He wishes to stop coumadin and start pradaxa.  We will therefore start pradaxa 150mg  two times a day today.  Problem # 2:  CONGESTIVE HEART FAILURE (ICD-428.0) the patient has chronic systolic dysfunction and NICM.  I believe that his low EF was likely tachycardia mediated.  As he has been in sinus rhythm since January, we will repeat an echocardiogram to evaluate his EF. Continue  current medicines.  Problem # 3:  VENTRICULAR TACHYCARDIA (ICD-427.1) no further episodes of VT controlled with amiodarone  Problem # 4:  MORBID OBESITY (ICD-278.01) aggressive weight loss was advised he is instructed to exercise regularly and reduce caloric intake. I would like for him to lose 2 lbs per week until I see him again in 12 weeks may need to consider gastric bypass  Problem # 5:  HYPERTENSION (ICD-401.9) stable  Other Orders: EKG w/ Interpretation (93000)  Patient Instructions: 1)  Your physician recommends that you schedule a follow-up appointment in: 3 months with Dr Johney Frame 2)  Your physician has recommended you make the following change in your medication: start Pradaxa 150mg  two times a day Prescriptions: PRADAXA 150 MG CAPS (DABIGATRAN ETEXILATE MESYLATE) one by mouth two times a day  #60 x 2   Entered by:   Dennis Bast, RN, BSN   Authorized by:   Hillis Range, MD   Signed by:   Dennis Bast, RN, BSN on 11/12/2009   Method used:   Electronically to        [facility] [address] (retail)       [address] [place], [place]  [address]       Ph: [phone] or [phone]       Fax: [phone]   RxID:   [id number]

## 2010-08-04 NOTE — Medication Information (Signed)
Summary: rov/sak  Anticoagulant Therapy  Managed by: Eda Keys, PharmD Referring MD: Valera Castle MD PCP: Dr. Katrine Coho MD: Jens Som MD, Arlys John Indication 1: Atrial Fibrillation (ICD-427.31) Indication 2: atrial thrombus (ICD-002) Lab Used: [facility] Site: [facility] INR POC 2.3 INR RANGE 2 - 3  Dietary changes: no    Health status changes: no    Bleeding/hemorrhagic complications: no    Recent/future hospitalizations: no    Any changes in medication regimen? no    Recent/future dental: no  Any missed doses?: no       Is patient compliant with meds? yes       Allergies: No Known Drug Allergies  Anticoagulation Management History:      The patient is taking warfarin and comes in today for a routine follow up visit.  Negative risk factors for bleeding include an age less than 43 years old.  The bleeding index is 'low risk'.  Positive CHADS2 values include History of CHF and History of HTN.  Negative CHADS2 values include Age > 74 years old.  The start date was 02/21/2004.  His last INR was 1.9.  Anticoagulation responsible provider: Jens Som MD, Arlys John.  INR POC: 2.3.  Cuvette Lot#: [id number].  Exp: 12/2010.    Anticoagulation Management Assessment/Plan:      The patient's current anticoagulation dose is Warfarin sodium 5 mg  tabs: as directed.  The target INR is 2 - 3.  The next INR is due 11/12/2009.  Anticoagulation instructions were given to patient.  Results were reviewed/authorized by Eda Keys, PharmD.  He was notified by [name].         Prior Anticoagulation Instructions: INR 1.7  Take 1.5 tablets daily excet 1 tablet on Sunday's. Recheck in 2 weeks  Current Anticoagulation Instructions: INR-2.3 Continue on schedule.Take 1.5 tablets daily except  take 1 tablet on Sunday's of each week.Return in 1 week

## 2010-08-04 NOTE — Medication Information (Signed)
Summary: ROV/TP  Anticoagulant Therapy  Managed by: Weston Brass, PharmD Referring MD: Valera Castle MD PCP: Lemont Fillers FNP Supervising MD: Johney Frame MD, Fayrene Fearing Indication 1: Atrial Fibrillation (ICD-427.31) Indication 2: atrial thrombus (ICD-002) Lab Used: [facility] Site: [facility] INR POC 5.0 INR RANGE 2 - 3  Dietary changes: no    Health status changes: no    Bleeding/hemorrhagic complications: no    Recent/future hospitalizations: no    Any changes in medication regimen? no    Recent/future dental: no  Any missed doses?: no       Is patient compliant with meds? yes       Allergies: No Known Drug Allergies  Anticoagulation Management History:      The patient is taking warfarin and comes in today for a routine follow up visit.  Negative risk factors for bleeding include an age less than 52 years old.  The bleeding index is 'low risk'.  Positive CHADS2 values include History of CHF and History of HTN.  Negative CHADS2 values include Age > 16 years old.  The start date was 02/21/2004.  His last INR was 1.9.  Anticoagulation responsible Ewin Rehberg: Allred MD, Fayrene Fearing.  INR POC: 5.0.  Exp: 04/2011.    Anticoagulation Management Assessment/Plan:      The patient's current anticoagulation dose is Warfarin sodium 5 mg tabs: Take 1 tablet by mouth once a day.  The target INR is 2 - 3.  The next INR is due 03/10/2010.  Anticoagulation instructions were given to patient.  Results were reviewed/authorized by Weston Brass, PharmD.  He was notified by Weston Brass PharmD.         Prior Anticoagulation Instructions: INR:  2.6  Take 1.5 tablets on Monday, Wednesday, Friday and 1 tablet every other day.    Current Anticoagulation Instructions: INR 5.0  Skip today and tomorrow's dose of Coumadin then decrease dose to 1 tablet every day except 1 1/2 tablets on Tuesday and Thursday.

## 2010-08-04 NOTE — Assessment & Plan Note (Signed)
Summary: f/u 3 months   Visit Type:  Follow-up Referring Sharia Averitt:  Juanito Doom MD Primary Shonna Deiter:  Lemont Fillers FNP   History of Present Illness: The patient presents today for routine electrophysiology followup. He reports doing very well since last being seen in our clinic.  He continues to struggle with obesity.  The patient denies symptoms of palpitations, chest pain, shortness of breath, orthopnea, PND, lower extremity edema, dizziness, presyncope, syncope, or neurologic sequela. The patient is tolerating medications without difficulties and is otherwise without complaint today.   Current Medications (verified): 1)  Metoprolol Tartrate 100 Mg Tabs (Metoprolol Tartrate) .... One Tablet By Mouth Two Times A Day 2)  Lisinopril 10 Mg  Tabs (Lisinopril) .... Take One Tablet Twice Daily 3)  Furosemide 80 Mg Tabs (Furosemide) .... Take 1 1/2 Tablets in The Am and One in The Pm As Directed 4)  Diltiazem Hcl Er Beads 360 Mg Xr24h-Cap (Diltiazem Hcl Er Beads) .... One By Mouth Once Daily 5)  Amiodarone Hcl 200 Mg Tabs (Amiodarone Hcl) .... Take One Tablet By Mouth Once Daily 6)  Cpap 13 Cm 7)  Warfarin Sodium 5 Mg Tabs (Warfarin Sodium) .... Take 1 Tablet By Mouth Once A Day 8)  Tramadol Hcl 50 Mg Tabs (Tramadol Hcl) .... One To Two Tablets Every 8 Hours As Needed For Breakthrough Pain Not Relieved By Tylenol  Allergies (verified): No Known Drug Allergies  Past History:  Past Medical History: Reviewed history from 03/02/2009 and no changes required. 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 Abscess L axilla Cardioversion-- 05/2009  Social History: Reviewed history from 02/02/2010 and no  changes required. 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   Vital Signs:  Patient profile:   50 year old male Height:      68 inches Weight:      391 pounds BMI:     59.67 Pulse rate:   77 / minute BP sitting:   144 / 90  (left arm)  Vitals Entered By: Laurance Flatten CMA (February 08, 2010 2:03 PM)  Physical Exam  General:  morbidly obese, NAD Head:  Normocephalic and atraumatic without obvious abnormalities. No apparent alopecia or balding. Eyes:  PERRLA/EOM intact; conjunctiva and lids normal. Mouth:  Teeth, gums and palate normal. Oral mucosa normal. Neck:  Neck supple, no JVD. No masses, thyromegaly or abnormal cervical nodes. Lungs:  Normal respiratory effort, chest expands symmetrically. Lungs are clear to auscultation, no crackles or wheezes. Heart:  Normal rate and regular rhythm. S1 and S2 normal without gallop, murmur, click, rub or other extra sounds. Abdomen:  Protuberant, but soft, non-tender Msk:  no joint swelling and no joint warmth.   Pulses:  pulses normal in all 4 extremities Extremities:  chronic venous stasis changes in bilateral lower extremities.1+ left pedal edema and 1+ right pedal edema.   Neurologic:  Bilateral patellar reflexes are slightly hyper-reflexive. alert & oriented X3 and gait normal.      EKG  Procedure date:  02/08/2010  Findings:      sinus rhythm 77 bpm, nonspecific ST/T changes  Impression & Recommendations:  Problem # 1:  VENTRICULAR TACHYCARDIA (ICD-427.1)  no further episodes of VT controlled with amiodarone we will check LFTs, TFTs, and CXR today. R/B of amiodarone were again discussed at length w/ patient.  He wishes to continue amiodarone longterm  Orders: TLB-BMP (Basic Metabolic Panel-BMET) (80048-METABOL) TLB-T4 (Thyrox), Free [phone]) TLB-TSH (Thyroid Stimulating Hormone)  (84443-TSH) TLB-Hepatic/Liver Function Pnl (80076-HEPATIC)  Problem # 2:  ATRIAL FIBRILLATION (ICD-427.31)  maintaining sinus rhythm continue coumadin stop cardizem  Orders: TLB-BMP (Basic Metabolic Panel-BMET) (80048-METABOL) TLB-T4 (Thyrox), Free [phone]) TLB-TSH (Thyroid Stimulating Hormone) (84443-TSH) TLB-Hepatic/Liver Function Pnl (80076-HEPATIC) T-2 View CXR (71020TC)  Problem # 3:  MORBID OBESITY (ICD-278.01) weight loss again advised we will refer to [facility] Bariatric surgery program  Problem # 4:  CONGESTIVE HEART FAILURE (ICD-428.0)  stable salt restriction advised we will repeat Echo to evaluate EF after maintaining sinus  The following medications were removed from the medication list:    Diltiazem Hcl Er Beads 360 Mg Xr24h-cap (Diltiazem hcl er beads) ..... One by mouth once daily His updated medication list for this problem includes:    Metoprolol Tartrate 100 Mg Tabs (Metoprolol tartrate) ..... One tablet by mouth two times a day    Lisinopril 10 Mg Tabs (Lisinopril) .Marland Kitchen... Take one tablet twice daily    Furosemide 80 Mg Tabs (Furosemide) .Marland Kitchen... Take 1 1/2 tablets in the am and one in the pm as directed    Amiodarone Hcl 200 Mg Tabs (Amiodarone hcl) .Marland Kitchen... Take one tablet by mouth once daily    Warfarin Sodium 5 Mg Tabs (Warfarin sodium) .Marland Kitchen... Take 1 tablet by mouth once a day  Patient Instructions: 1)  Your physician recommends that you schedule a follow-up appointment in: 3 months with DrAllred 2)  Your physician has requested that you have an echocardiogram.  Echocardiography is a painless test that uses sound waves to create images of your heart. It provides your doctor with information about the size and shape of your heart and how well your heart's chambers and valves are working.  This procedure takes approximately one hour. There are no restrictions for this procedure. 3)  You have been referred to [facility] [phone] or [phone] 4)  Your  physician has recommended you make the following change in your medication: stop Cardizem

## 2010-08-04 NOTE — Medication Information (Signed)
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 no    Recent/future dental: no  Any missed doses?: no       Is patient compliant with meds? yes       Allergies: No Known Drug Allergies  Anticoagulation Management History:      The patient is taking warfarin and comes in today for a routine follow up visit.  Negative risk factors for bleeding include an age less than 19 years old.  The bleeding index is 'low risk'.  Positive CHADS2 values include History of CHF and History of HTN.  Negative CHADS2 values include Age > 38 years old.  The start date was 02/21/2004.  His last INR was 1.9 and today's INR is 6.7.  Prothrombin time is 71.7.  Anticoagulation responsible provider: Juanda Chance MD, Smitty Cords.  INR POC: 6.8.  Cuvette Lot#: [id number].  Exp: 05/2011.    Anticoagulation Management Assessment/Plan:      The patient's current anticoagulation dose is Warfarin sodium 5 mg tabs: Take 1 tablet by mouth once a day.  The target INR is 2 - 3.  The next INR is due 04/16/2010.  Anticoagulation instructions were given to patient.  Results were reviewed/authorized by Weston Brass, PharmD.  He was notified by Weston Brass PharmD.         Prior Anticoagulation Instructions: INR 4.3  Skip tomorrow's dose and take one-half tablet on Sunday, then begin one tablet every day except one and one-half tablets on Thursday.  We will see you in two weeks.   Current Anticoagulation Instructions: INR 6.8 INR from lab- 6.7  LMOM for pt. Weston Brass PharmD  April 09, 2010 4:04 PM   Spoke with pt.  Hold Coumadin x 3 days then decrease dose to 1 tablet every day.  Recheck INR in 1 week.

## 2010-08-04 NOTE — Medication Information (Signed)
Summary: rov/sp  Anticoagulant Therapy  Managed by: Bethena Midget, RN, BSN Referring MD: Valera Castle MD PCP: Dr. 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 no    Recent/future dental: no  Any missed doses?: no       Is patient compliant with meds? yes      Comments: Just restarted on 12/07/09 s/p discontinuing Pradaxa.   Allergies: No Known Drug Allergies  Anticoagulation Management History:      The patient is taking warfarin and comes in today for a routine follow up visit.  Negative risk factors for bleeding include an age less than 20 years old.  The bleeding index is 'low risk'.  Positive CHADS2 values include History of CHF and History of HTN.  Negative CHADS2 values include Age > 63 years old.  The start date was 02/21/2004.  His last INR was 1.9.  Anticoagulation responsible provider: Myrtis Ser MD, Tinnie Gens.  INR POC: 1.4.  Cuvette Lot#: [id number].  Exp: 02/2011.    Anticoagulation Management Assessment/Plan:      The target INR is 2 - 3.  The next INR is due 12/18/2009.  Anticoagulation instructions were given to patient.  Results were reviewed/authorized by Bethena Midget, RN, BSN.  He was notified by Bethena Midget, RN, BSN.         Prior Anticoagulation Instructions: INR 1.5  Continue same dose of 1 1/2 tablets (7.5mg ) every day except 1 tablet (5mg ) on Sunday   Current Anticoagulation Instructions: INR 1.4 Continue 7.5mg s everyday except 5mg  on Sundays. Recheck in 7-10 days.

## 2010-08-04 NOTE — Miscellaneous (Signed)
Summary: PT Initial Summary/[facility]  PT Initial [facility]   Imported By: Lanelle Bal 02/19/2010 10:20:48  _____________________________________________________________________  External Attachment:    Type:   Image     Comment:   External Document

## 2010-08-04 NOTE — Consult Note (Signed)
Summary: [facility]  [facility]   Imported By: Lanelle Bal 04/07/2010 14:27:36  _____________________________________________________________________  External Attachment:    Type:   Image     Comment:   External Document

## 2010-08-04 NOTE — Letter (Signed)
Summary: Handout Printed  Printed Handout:  - Coumadin Instructions-w/out Meds 

## 2010-08-04 NOTE — Medication Information (Signed)
Summary: Coumadin Clinic  Anticoagulant Therapy  Managed by: Inactive Referring MD: Valera Castle MD PCP: Dr. 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 Johney Frame   Allergies: No Known Drug Allergies  Anticoagulation Management History:      Negative risk factors for bleeding include an age less than 71 years old.  The bleeding index is 'low risk'.  Positive CHADS2 values include History of CHF and History of HTN.  Negative CHADS2 values include Age > 79 years old.  The start date was 02/21/2004.  His last INR was 1.9.  Anticoagulation responsible provider: Lesslie Mossa MD, Fayrene Fearing.  Exp: 02/2011.    Anticoagulation Management Assessment/Plan:      The target INR is 2 - 3.  The next INR is due 11/19/2009.  Anticoagulation instructions were given to patient.  Results were reviewed/authorized by Inactive.         Prior Anticoagulation Instructions: INR 1.5  Continue same dose of 1 1/2 tablets (7.5mg ) every day except 1 tablet (5mg ) on Sunday

## 2010-08-04 NOTE — Medication Information (Signed)
Summary: ccr/jss  Anticoagulant Therapy  Managed by: Eda Keys, PharmD Referring MD: Valera Castle MD PCP: Dr. 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 no    Recent/future dental: no  Any missed doses?: no       Is patient compliant with meds? yes       Allergies: No Known Drug Allergies  Anticoagulation Management History:      Negative risk factors for bleeding include an age less than 69 years old.  The bleeding index is 'low risk'.  Positive CHADS2 values include History of CHF and History of HTN.  Negative CHADS2 values include Age > 21 years old.  The start date was 02/21/2004.  His last INR was 1.9.  Anticoagulation responsible provider: Bensimhon MD, Reuel Boom.  INR POC: 1.5.  Exp: 08/2010.    Anticoagulation Management Assessment/Plan:      The patient's current anticoagulation dose is Warfarin sodium 5 mg  tabs: as directed.  The target INR is 2 - 3.  The next INR is due 10/06/2009.  Anticoagulation instructions were given to patient.  Results were reviewed/authorized by Eda Keys, PharmD.  He was notified by Eda Keys.         Prior Anticoagulation Instructions: INR 1.9  Take 1.5 tabs today, then 1.5 tabs each Monday and Thursday and 1 tab on all other days.  Recheck in 2 - 3 weeks.    Current Anticoagulation Instructions: INR 1.5  Take 1.5 tablets today.  Then start NEW dosing schedule of 1.5 tablets on Monday, Wednesday, and Friday, and 1 tablet all other days.  Return to clinic in 2 weeks.

## 2010-08-04 NOTE — Medication Information (Signed)
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 no    Recent/future dental: no  Any missed doses?: yes     Details: Missed one dose 2 days ago  Is patient compliant with meds? yes       Allergies: No Known Drug Allergies  Anticoagulation Management History:      The patient is taking warfarin and comes in today for a routine follow up visit.  Negative risk factors for bleeding include an age less than 29 years old.  The bleeding index is 'low risk'.  Positive CHADS2 values include History of CHF and History of HTN.  Negative CHADS2 values include Age > 42 years old.  The start date was 02/21/2004.  His last INR was 1.9.  Anticoagulation responsible provider: Clifton James MD, Cristal Deer.  INR POC: 2.6.  Cuvette Lot#: [id number].  Exp: 04/2011.    Anticoagulation Management Assessment/Plan:      The patient's current anticoagulation dose is Warfarin sodium 5 mg tabs: Take 1 tablet by mouth once a day.  The target INR is 2 - 3.  The next INR is due 02/22/2010.  Anticoagulation instructions were given to patient.  Results were reviewed/authorized by Geoffry Paradise, PharmD.         Prior Anticoagulation Instructions: INR 4.0  Skip today's dose of Coumadin then decrease dose to 1 1/ 2 tablets every day except 1 tablet on Tuesday, Thursday and Saturday.   Current Anticoagulation Instructions: INR:  2.6  Take 1.5 tablets on Monday, Wednesday, Friday and 1 tablet every other day.

## 2010-08-04 NOTE — Letter (Signed)
Summary: Appt Sheduled/[facility]  Appt Sheduled/[facility]   Imported By: Lanelle Bal 03/05/2010 08:08:23  _____________________________________________________________________  External Attachment:    Type:   Image     Comment:   External Document

## 2010-08-04 NOTE — Medication Information (Signed)
Summary: rov.mp  Anticoagulant Therapy  Managed by: Shelby Dubin PharmD, BCPS, CPP Referring MD: Valera Castle MD PCP: Dr. 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 yes       Details: finished avelox 400 mg daily course  Recent/future dental: no  Any missed doses?: no       Is patient compliant with meds? yes       Allergies (verified): No Known Drug Allergies  Anticoagulation Management History:      The patient is taking warfarin and comes in today for a routine follow up visit.  Negative risk factors for bleeding include an age less than 6 years old.  The bleeding index is 'low risk'.  Positive CHADS2 values include History of CHF and History of HTN.  Negative CHADS2 values include Age > 68 years old.  The start date was 02/21/2004.  His last INR was 6.2 ratio.  Anticoagulation responsible provider: Daleen Squibb MD, Maisie Fus.  Cuvette Lot#: [id number].  Exp: 08/2010.    Anticoagulation Management Assessment/Plan:      The patient's current anticoagulation dose is Warfarin sodium 5 mg  tabs: as directed.  The target INR is 2 - 3.  The next INR is due 07/17/2009.  Anticoagulation instructions were given to patient.  Results were reviewed/authorized by Shelby Dubin PharmD, BCPS, CPP.  He was notified by Shelby Dubin PharmD, BCPS, CPP.         Prior Anticoagulation Instructions: INR 1.9  Take 1 tab daily (5 mg), except 1.5 (7.5 mg) on Tuesdays and Thursdays.   Recheck in one week. Pending [facility].     Current Anticoagulation Instructions: INR 3.4  Skip 1 day then 1.5 tabs each Thursday and 1 tab on all other days.   Recheck in 10 days.

## 2010-08-04 NOTE — Assessment & Plan Note (Signed)
Summary: per check out/sf   Visit Type:  Follow-up Referring Bunnie Lederman:  Juanito Doom MD Primary Asriel Westrup:  Lemont Fillers FNP   History of Present Illness: The patient presents today for routine electrophysiology followup. He reports doing very well since last being seen in our clinic.  He continues to struggle with obesity.  He is not exercising and is making no effort to lose weight.  The patient denies symptoms of palpitations, chest pain, shortness of breath, orthopnea, PND, lower extremity edema, dizziness, presyncope, syncope, or neurologic sequela. The patient is tolerating medications without difficulties and is otherwise without complaint today.   Current Medications (verified): 1)  Metoprolol Tartrate 100 Mg Tabs (Metoprolol Tartrate) .... One Tablet By Mouth Two Times A Day 2)  Lisinopril 10 Mg  Tabs (Lisinopril) .... Take One Tablet Twice Daily 3)  Furosemide 80 Mg Tabs (Furosemide) .... As Needed 4)  Amiodarone Hcl 200 Mg Tabs (Amiodarone Hcl) .... Take One Tablet By Mouth Once Daily 5)  Cpap 13 Cm 6)  Warfarin Sodium 5 Mg Tabs (Warfarin Sodium) .... Uad 7)  Tramadol Hcl 50 Mg Tabs (Tramadol Hcl) .... One Tablet Every 8 Hours As Needed For Severe Pain  Allergies (verified): No Known Drug Allergies  Past History:  Past Medical History: Reviewed history from 03/02/2009 and no changes required. 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 Abscess L axilla Cardioversion-- 05/2009  Social History: Reviewed history from 02/02/2010 and no changes required. 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   Vital Signs:  Patient profile:   50 year old male Height:      68 inches Weight:      397 pounds BMI:     60.58 Pulse rate:   63 / minute BP sitting:   140 / 86  (left arm)  Vitals Entered By: Laurance Flatten CMA (May 10, 2010 1:43 PM)  Physical Exam  General:  Well-developed,well-nourished,in no acute distress; alert,appropriate and cooperative throughout examination Head:  normocephalic and atraumatic Eyes:  PERRLA/EOM intact; conjunctiva and lids normal. Mouth:  Teeth, gums and palate normal. Oral mucosa normal. Neck:  supple, JVP 8cm Lungs:  Clear bilaterally to auscultation and percussion. Heart:  RRR, no m/r/g Abdomen:  Bowel sounds positive; abdomen soft and non-tender without masses, organomegaly, or hernias noted. No hepatosplenomegaly. Msk:  no joint swelling and no joint warmth.   Pulses:  pulses normal in all 4 extremities Extremities:  chronic venous stasis changes1+ left pedal edema and 1+ right pedal edema.   Neurologic:  bilateral LE strength is 5/5   EKG  Procedure date:  05/10/2010  Findings:      sinus rhythm 63 bpm, PR 160, QTc 340, otherwise normal ekg  Impression & Recommendations:  Problem # 1:  ATRIAL FIBRILLATION (ICD-427.31) maintaining sinus rhythm with amiodarone continue coumadin longterm for stroke prevention recent LFTs and TFTs were normal we will attempt to decrease amiodarone to 100mg  daily to minimize longterm risks of this medicine  Problem # 2:  VENTRICULAR TACHYCARDIA (ICD-427.1) well controlled with amiodarone we will decrease amiodarone to 100mg  daily  Given his morbid obesity, he is presently a  poor candidate for ICD implantation  Problem # 3:  CONGESTIVE HEART FAILURE (ICD-428.0) The patient has chronic systolic dysfunction Recent echo results were reviewed with the patient given his morbid obesity, he is not a candidate for ICD at this time continue medical therapy  Problem  # 4:  MORBID OBESITY (ICD-278.01) weight loss again advised Unfornately, he is not seriously interested in weight loss at this time  Problem # 5:  HYPERTENSION (ICD-401.9) stable  Patient Instructions: 1)  Your physician recommends that you schedule a follow-up appointment in: 6 months. 2)  Your physician has recommended you make the following change in your medication: Decrease Amiodarone to 100mg  once daily.

## 2010-08-04 NOTE — Progress Notes (Signed)
Summary: question regarding going back on coumadin  Phone Note Call from Patient Call back at Home Phone [phone]   Caller: Patient Reason for Call: Talk to Nurse, Talk to Doctor Summary of Call: pt has a question about going back on coumadin Initial call taken by: Omer Jack,  December 02, 2009 10:37 AM  Follow-up for Phone Call        Pradaxa too expensive.  Will restart Warfarin at 5mg  tonight after 48 hours will stop Pradaxa.  Sent [facility] a note to check INR  Pt aware Dennis Bast, RN, BSN  December 02, 2009 4:36 PM

## 2010-08-04 NOTE — Letter (Signed)
Summary: [facility]  [facility]   Imported By: Lester Washburn 07/13/2009 10:47:09  _____________________________________________________________________  External Attachment:    Type:   Image     Comment:   External Document

## 2010-08-04 NOTE — Medication Information (Signed)
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 no    Recent/future dental: no  Any missed doses?: no       Is patient compliant with meds? yes       Allergies: No Known Drug Allergies  Anticoagulation Management History:      The patient is taking warfarin and comes in today for a routine follow up visit.  Negative risk factors for bleeding include an age less than 22 years old.  The bleeding index is 'low risk'.  Positive CHADS2 values include History of CHF and History of HTN.  Negative CHADS2 values include Age > 50 years old.  The start date was 02/21/2004.  His last INR was 1.9.  Anticoagulation responsible provider: Tenny Craw MD, Gunnar Fusi.  INR POC: 4.3.  Cuvette Lot#: [id number].  Exp: 05/2011.    Anticoagulation Management Assessment/Plan:      The patient's current anticoagulation dose is Warfarin sodium 5 mg tabs: Take 1 tablet by mouth once a day.  The target INR is 2 - 3.  The next INR is due 04/09/2010.  Anticoagulation instructions were given to patient.  Results were reviewed/authorized by Weston Brass, PharmD.  He was notified by Kennieth Francois.         Prior Anticoagulation Instructions: INR 2.4  Continue taking 1 tablet every day except 1 1/2 tablets on Tuesdays and Thursdays. Re-check INR in 2 weeks.   Current Anticoagulation Instructions: INR 4.3  Skip tomorrow's dose and take one-half tablet on Sunday, then begin one tablet every day except one and one-half tablets on Thursday.  We will see you in two weeks.

## 2010-08-04 NOTE — Medication Information (Signed)
Summary: rov/tm  Anticoagulant Therapy  Managed by: Rolland Porter, PharmD Referring MD: Valera Castle MD PCP: Dr. 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 no    Recent/future dental: no  Any missed doses?: no       Is patient compliant with meds? yes       Current Medications (verified): 1)  Metoprolol Tartrate 100 Mg Tabs (Metoprolol Tartrate) .... 2 Tabs Twice Daily 2)  Lisinopril 10 Mg  Tabs (Lisinopril) .... Take One Tablet Twice Daily 3)  Furosemide 80 Mg Tabs (Furosemide) .... Take 1 1/2 Tablets in The Am and One in The Pm As Directed 4)  Warfarin Sodium 5 Mg  Tabs (Warfarin Sodium) .... As Directed 5)  Tramadol Hcl 50 Mg  Tabs (Tramadol Hcl) .Marland Kitchen.. 1 -2 Q 6-8 Hrs As Needed Pain 6)  Diltiazem Hcl Er Beads 360 Mg Xr24h-Cap (Diltiazem Hcl Er Beads) .... One By Mouth Once Daily 7)  Amiodarone Hcl 200 Mg Tabs (Amiodarone Hcl) .... Take One Tablet By Mouth Once Daily  Allergies (verified): No Known Drug Allergies  Anticoagulation Management History:      Negative risk factors for bleeding include an age less than 33 years old.  The bleeding index is 'low risk'.  Positive CHADS2 values include History of CHF and History of HTN.  Negative CHADS2 values include Age > 27 years old.  The start date was 02/21/2004.  His last INR was 1.9.  Anticoagulation responsible provider: Juanda Chance MD, Smitty Cords.  INR POC: 1.7.  Exp: 08/2010.    Anticoagulation Management Assessment/Plan:      The patient's current anticoagulation dose is Warfarin sodium 5 mg  tabs: as directed.  The target INR is 2 - 3.  The next INR is due 11/02/2009.  Anticoagulation instructions were given to  patient.  Results were reviewed/authorized by Rolland Porter, PharmD.  He was notified by Rolland Porter.         Prior Anticoagulation Instructions: INR 1.9. Take 1.5 tablets today, then take 1.5 tablets daily except 1 tablet Tues, Thurs, Sun.  Current Anticoagulation Instructions: INR 1.7  Take 1.5 tablets daily excet 1 tablet on Sunday's. Recheck in 2 weeks

## 2010-08-04 NOTE — Progress Notes (Signed)
Summary: refill--tramadol  Phone Note Refill Request Call back at [phone] Message from:  Patient on February 08, 2010 3:43 PM  Refills Requested: Medication #1:  TRAMADOL HCL 50 MG TABS one to two tablets every 8 hours as needed for breakthrough pain not relieved by tylenol.   Dosage confirmed as above?Dosage Confirmed Pt last seen 02/02/10. Please advise.  Next Appointment Scheduled: 03/02/10 Peggyann Juba, NP Initial call taken by: Mervin Kung CMA Duncan Dull),  February 08, 2010 3:44 PM Caller: Patient Call For: Lemont Fillers FNP  Follow-up for Phone Call        Please call patient and let him know that I would like him to only use Tramadol on an "as needed basis".  He should use Tylenol first, then one tablet of Tramadol.  He should only use the two tablets for severe pain- not on a standing basis.  Refill was sent to pharmacy- however tramadol  is only to be used short term.   Follow-up by: Lemont Fillers FNP,  February 08, 2010 3:54 PM  Additional Follow-up for Phone Call Additional follow up Details #1::        Pt notified and voices understanding.  Nicki Guadalajara Fergerson CMA Duncan Dull)  February 08, 2010 4:58 PM     Prescriptions: TRAMADOL HCL 50 MG TABS (TRAMADOL HCL) one to two tablets every 8 hours as needed for breakthrough pain not relieved by tylenol  #45 x 0   Entered and Authorized by:   Lemont Fillers FNP   Signed by:   Lemont Fillers FNP on 02/08/2010   Method used:   Electronically to        [facility]* (retail)       [address] [place]. [address]       [place], [place]  [address]       Ph: [phone] or [phone]       Fax: [phone]   RxID:   [id number]

## 2010-08-04 NOTE — Assessment & Plan Note (Signed)
Summary: MISSED AND APPT/MHF--Rm 6   Vital Signs:  Patient profile:   50 year old male Height:      68 inches Weight:      383.50 pounds BMI:     58.52 Temp:     98.3 degrees F oral Pulse rate:   90 / minute Pulse rhythm:   regular Resp:     18 per minute BP sitting:   130 / 76  (right arm) Cuff size:   thigh  Vitals Entered By: Mervin Kung CMA Duncan Dull) (April 05, 2010 2:58 PM) CC: rm 6   Follow up. Pt needs refill on Tramadol. Is Patient Diabetic? No  Does patient need assistance? Functional Status Self care Comments Pt states Furosemide was stopped as it wasn't needed any longer. Nicki Guadalajara Fergerson CMA Duncan Dull)  April 05, 2010 3:06 PM    Primary Care Provider:  Lemont Fillers FNP  CC:  rm 6   Follow up. Pt needs refill on Tramadol.Marland Kitchen  History of Present Illness: 7-10 day cycle.  Pain in his foot, on day 5 pain becomes excruciating.  Pain is in center lf the ankle and goes to the heel.  Patient notes that it "runs its course."  2 days ago this happened, but is now resolved.  Usually happened about 2x a year.  Not associated with redness or swelling.  Had uric acid level checked at that time in ED and was apparently normal.    CHF- stopped lasix about 8 mos ago.  Notes that since his cardioversion he has not been needing his lasix.   Low back pain-  lumbar area.  Non-radiating.  Remains unchanged. Requesting refill on his tramadol.    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 Lungs are clear to auscultation, no crackles or wheezes. Heart:  Normal rate and regular rhythm. S1 and S2 normal without gallop, murmur, click, rub or other extra sounds. Extremities:  chronic venous stasis changes1+ left pedal edema and 1+ right pedal edema.   Neurologic:  bilateral LE strength is 5/5   Impression & Recommendations:  Problem # 1:  LOW BACK PAIN, CHRONIC (ICD-724.2) Assessment Unchanged Pt underwent evaluation at [facility]/spine. It was felt that there was no surgical intervention needed.  He was instructed to lose weight.  I reinforced this with him today.  Will plan referral to pain management as I suspect that his pain is going to continue to be a chronic issue.  I discussed with patient that I will manage his pain for the short term, but that long term Pain Management will need to take this over.  His updated medication list for this problem includes:    Tramadol Hcl 50 Mg Tabs (Tramadol hcl) ..... One tablet every 8 hours as needed for severe pain  Orders: Pain Clinic Referral (Pain)  Problem # 2:  CONGESTIVE HEART FAILURE (ICD-428.0) Assessment: Improved Stable off of furosemide.  He tells me that his cardiologist is aware that he has discontinued the furosemide.  His updated medication list for this problem includes:  Metoprolol Tartrate 100 Mg Tabs (Metoprolol tartrate) ..... One tablet by mouth two times a day    Lisinopril 10 Mg Tabs (Lisinopril) .Marland Kitchen... Take one tablet twice daily    Furosemide 80 Mg Tabs (Furosemide) .Marland Kitchen... Take 1 1/2 tablets in the am and one in the pm as directed    Warfarin Sodium 5 Mg Tabs (Warfarin sodium) .Marland Kitchen... Take 1 tablet by mouth once a day  Complete Medication List: 1)  Metoprolol Tartrate 100 Mg Tabs (Metoprolol tartrate) .... One tablet by mouth two times a day 2)  Lisinopril 10 Mg Tabs (Lisinopril) ....  Take one tablet twice daily 3)  Furosemide 80 Mg Tabs (Furosemide) .... Take 1 1/2 tablets in the am and one in the pm as directed 4)  Amiodarone Hcl 200 Mg Tabs (Amiodarone hcl) .... Take one tablet by mouth once daily 5)  Cpap 13 Cm  6)  Warfarin Sodium 5 Mg Tabs (Warfarin sodium) .... Take 1 tablet by mouth once a day 7)  Tramadol Hcl 50 Mg Tabs (Tramadol hcl) .... One tablet every 8 hours as needed for severe pain  Patient Instructions: 1)  You will be contacted about your referral to pain management. 2)  Please schedule a medicare wellness visit at your convenience. Prescriptions: TRAMADOL HCL 50 MG TABS (TRAMADOL HCL) one tablet every 8 hours as needed for severe pain  #90 x 0   Entered and Authorized by:   Lemont Fillers FNP   Signed by:   Lemont Fillers FNP on 04/05/2010   Method used:   Electronically to        [facility]* (retail)       [address] [place]. [address]       [place], [place]  [address]       Ph: [phone] or [phone]       Fax: [phone]   RxID:   [id number]   Current Allergies (reviewed today): No known allergies

## 2010-08-04 NOTE — Medication Information (Signed)
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 yes       Details: changing amiodarone from 200mg  daily to 100mg  daily  Recent/future dental: no  Any missed doses?: yes     Details: Pt was confused with regimen- has been skipping saturday doses  Is patient compliant with meds? yes       Allergies: No Known Drug Allergies  Anticoagulation Management History:      Negative risk factors for bleeding include an age less than 30 years old.  The bleeding index is 'low risk'.  Positive CHADS2 values include History of CHF and History of HTN.  Negative CHADS2 values include Age > 43 years old.  The start date was 02/21/2004.  His last INR was 6.7 ratio.  Anticoagulation responsible provider: Allred MD, Fayrene Fearing.  INR POC: 1.6.  Exp: 05/2011.    Anticoagulation Management Assessment/Plan:      The patient's current anticoagulation dose is Warfarin sodium 5 mg tabs: Take 1 tablet by mouth once a day.  The target INR is 2 - 3.  The next INR is due 04/16/2010.  Anticoagulation instructions were given to patient.  Results were reviewed/authorized by Louann Sjogren, PharmD.         Prior Anticoagulation Instructions: INR 6.8 INR from lab- 6.7  LMOM for pt. Weston Brass PharmD  April 09, 2010 4:04 PM   Spoke with pt.  Hold Coumadin x 3 days then decrease dose to 1 tablet every day.  Recheck INR in 1 week.   Current Anticoagulation Instructions: INR 1.6 (goal 2-3)  Take 5mg  tablet everyday.  Will take extra tablet tonight.

## 2010-08-04 NOTE — Miscellaneous (Signed)
Summary: [facility]  Clinical Lists Changes  Observations: Added new observation of ECHOINTERP:  Study Conclusions    - Left ventricle: The cavity size was mildly dilated. Wall thickness     was increased in a pattern of mild LVH. Systolic function was     moderately to severely reduced. The estimated ejection fraction     was 35%. Moderate global hypokinesis. Doppler parameters are     consistent with abnormal left ventricular relaxation (grade 1     diastolic dysfunction).   - Aortic valve: There was no stenosis.   - Mitral valve: Trivial regurgitation.   - Left atrium: The atrium was moderately dilated.   - Right ventricle: The cavity size was mildly dilated.   - Right atrium: The atrium was mildly dilated.   - Pulmonary arteries: No complete TR doppler jet so unable to     estimate PA systolic pressure.   - Systemic veins: IVC measured 2.1 cm with some respirophasic     variation, suggesting RA pressure 10 mmHg.   Impressions:    - Technically difficult study with poor acoustic windows. Mildly     dilated LV with mild LV hypertrophy. There was moderate global     hypokinesis, EF 35%. Mild diastolic dysfunction. The RV was mildly     dilated with normal systolic function.   Transthoracic echocardiography. M-mode, complete 2D, spectral   Doppler, and color Doppler. Height: Height: 172.7cm. Height: 68in.   Weight: Weight: 177.4kg. Weight: 390.2lb. Body mass index: BMI:   59.5kg/m^2. Body surface area: BSA: 2.63m^2. Blood pressure: 142/99.   Patient status: Outpatient. 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    Chronic minor disc abnormalities from T5-6 through T7-8 without   gross neural compression.    Read By:  Thomasenia Sales,   M.D. (02/20/2010 9:10) Added new observation of CXR RESULTS:  Findings: Previously seen left lower lung infiltrate is no longer   visualized.  Cardiomegaly is stable.  Pulmonary interstitial   prominence is unchanged.  No evidence of pulmonary infiltrate or   edema.  No evidence of pleural effusion.  Stable hilar mediastinal   contours.    IMPRESSION:   Stable cardiomegaly.  No active lung disease.    Read By:  Danae Orleans,  M.D.   Released By:  Danae Orleans,  M.D. (02/08/2010 9:10)         MRI EXAM  Procedure date:  02/20/2010  Findings:        Findings: There is no significant finding at L3-4 or above.  The   discs are unremarkable.  The canal and foramina are widely patent.   The distal cord and conus are normal.    At L4-5, the disc is degenerated and bulges mildly.  The central   canal sufficiently patent.  There is mild foraminal narrowing   bilaterally.    At L5-S1, the disc is degenerated and shows shallow protrusion but   no apparent neural compression.  The canal and foramina appear   sufficiently patent.    IMPRESSION:   L4-5:  Disc bulge.  Mild foraminal narrowing without definite   neural compression.    L5-S1:  Shallow protrusion but without apparent canal or foraminal  stenosis.    The findings could relate to low back pain, but I do not   demonstrate neural compressive pathology.    Read By:  Thomasenia Sales,  M.D.  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    Chronic minor disc abnormalities from T5-6 through T7-8 without   gross neural compression.    Read By:  Thomasenia Sales,  M.D.  CXR  Procedure date:  02/08/2010  Findings:       Findings: Previously seen left lower lung infiltrate is no longer   visualized.  Cardiomegaly is stable.  Pulmonary interstitial   prominence is unchanged.   No evidence of pulmonary infiltrate or   edema.  No evidence of pleural effusion.  Stable hilar mediastinal   contours.    IMPRESSION:   Stable cardiomegaly.  No active lung disease.    Read By:  Danae Orleans,  M.D.   Released By:  Danae Orleans,  M.D.  Echocardiogram  Procedure date:  02/24/2010  Findings:       Study Conclusions    - Left ventricle: The cavity size was mildly dilated. Wall thickness     was increased in a pattern of mild LVH. Systolic function was     moderately to severely reduced. The estimated ejection fraction     was 35%. Moderate global hypokinesis. Doppler parameters are     consistent with abnormal left ventricular relaxation (grade 1     diastolic dysfunction).   - Aortic valve: There was no stenosis.   - Mitral valve: Trivial regurgitation.   - Left atrium: The atrium was moderately dilated.   - Right ventricle: The cavity size was mildly dilated.   - Right atrium: The atrium was mildly dilated.   - Pulmonary arteries: No complete TR doppler jet so unable to     estimate PA systolic pressure.   - Systemic veins: IVC measured 2.1 cm with some respirophasic     variation, suggesting RA pressure 10 mmHg.   Impressions:    - Technically difficult study with poor acoustic windows. Mildly     dilated LV with mild LV hypertrophy. There was moderate global     hypokinesis, EF 35%. Mild diastolic dysfunction. The RV was mildly     dilated with normal systolic function.   Transthoracic echocardiography. M-mode, complete 2D, spectral   Doppler, and color Doppler. Height: Height: 172.7cm. Height: 68in.   Weight: Weight: 177.4kg. Weight: 390.2lb. Body mass index: BMI:   59.5kg/m^2. Body surface area: BSA: 2.20m^2. Blood pressure: 142/99.   Patient status: Outpatient. Location: [facility]    Prepared and Electronically Authenticated by    Marca Ancona, MD

## 2010-08-04 NOTE — Letter (Signed)
Summary: CMN for CPAP Supplies/[facility]  CMN for CPAP Supplies/[facility]   Imported By: Sherian Rein 08/19/2009 07:39:31  _____________________________________________________________________  External Attachment:    Type:   Image     Comment:   External Document

## 2010-08-04 NOTE — Medication Information (Signed)
Summary: rov/kb  Anticoagulant Therapy  Managed by: Weston Brass, PharmD Referring MD: Valera Castle MD PCP: Dr. 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 no    Recent/future dental: no  Any missed doses?: no       Is patient compliant with meds? yes       Allergies: No Known Drug Allergies  Anticoagulation Management History:      The patient is taking warfarin and comes in today for a routine follow up visit.  Negative risk factors for bleeding include an age less than 10 years old.  The bleeding index is 'low risk'.  Positive CHADS2 values include History of CHF and History of HTN.  Negative CHADS2 values include Age > 15 years old.  The start date was 02/21/2004.  His last INR was 1.9.  Anticoagulation responsible provider: Manreet Kiernan MD, Fayrene Fearing.  INR POC: 1.5.  Cuvette Lot#: [id number].  Exp: 02/2011.    Anticoagulation Management Assessment/Plan:      The patient's current anticoagulation dose is Warfarin sodium 5 mg  tabs: as directed.  The target INR is 2 - 3.  The next INR is due 11/19/2009.  Anticoagulation instructions were given to patient.  Results were reviewed/authorized by Weston Brass, PharmD.  He was notified by Weston Brass PharmD.         Prior Anticoagulation Instructions: INR-2.3 Continue on schedule.Take 1.5 tablets daily except  take 1 tablet on Sunday's of each week.Return in 1 week  Current Anticoagulation Instructions: INR 1.5  Continue same dose of 1 1/2 tablets (7.5mg ) every day except 1 tablet (5mg ) on Sunday

## 2010-08-04 NOTE — Miscellaneous (Signed)
  Clinical Lists Changes  Observations: Added new observation of RESULTS MISC: DC CARDIOVERSION  After informed consent was obtained, the patient was prepped in the usual manner.  The Anesthesia Service was consulted under the direction of Dr. Michelle Piper.  The patient was sedated with etomidate.  He was cardioverted initially with 360 joules shock, failing to restore sinus rhythm; a second 360 joules shock, however, was delivered, which restored sinus rhythm.  The patient tolerated the procedure well.  The shock was delivered through the electrode dispersive pad in the anteroposterior position.   III. COMPLICATIONS:  There were no immediate procedure complications.   IV. RESULTS:  Demonstrate successful DC cardioversion in a patient with atrial fibrillation.     (07/08/2009 10:44)      MISC. Report  Procedure date:  07/08/2009  Findings:      DC CARDIOVERSION  After informed consent was obtained, the patient was prepped in the usual manner.  The Anesthesia Service was consulted under the direction of Dr. Michelle Piper.  The patient was sedated with etomidate.  He was cardioverted initially with 360 joules shock, failing to restore sinus rhythm; a second 360 joules shock, however, was delivered, which restored sinus rhythm.  The patient tolerated the procedure well.  The shock was delivered through the electrode dispersive pad in the anteroposterior position.   III. COMPLICATIONS:  There were no immediate procedure complications.   IV. RESULTS:  Demonstrate successful DC cardioversion in a patient with atrial fibrillation.

## 2010-08-04 NOTE — Medication Information (Signed)
Summary: ccr/jss  Anticoagulant Therapy  Managed by: Shelby Dubin PharmD, BCPS, CPP Referring MD: Valera Castle MD PCP: Dr. 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 no    Recent/future dental: no  Any missed doses?: no       Is patient compliant with meds? yes       Current Medications (verified): 1)  Metoprolol Tartrate 100 Mg Tabs (Metoprolol Tartrate) .... 2 Tabs Twice Daily 2)  Lisinopril 10 Mg  Tabs (Lisinopril) .... Take One Tablet Twice Daily 3)  Furosemide 80 Mg Tabs (Furosemide) .... Take 1 1/2 Tablets in The Am and One in The Pm As Directed 4)  Warfarin Sodium 5 Mg  Tabs (Warfarin Sodium) .... As Directed 5)  Tramadol Hcl 50 Mg  Tabs (Tramadol Hcl) .Marland Kitchen.. 1 -2 Q 6-8 Hrs As Needed Pain 6)  Diltiazem Hcl Er Beads 360 Mg Xr24h-Cap (Diltiazem Hcl Er Beads) .... One By Mouth Once Daily 7)  Amiodarone Hcl 200 Mg Tabs (Amiodarone Hcl) .... Take One Tablet By Mouth Once Daily  Allergies (verified): No Known Drug Allergies  Anticoagulation Management History:      The patient is taking warfarin and comes in today for a routine follow up visit.  Negative risk factors for bleeding include an age less than 72 years old.  The bleeding index is 'low risk'.  Positive CHADS2 values include History of CHF and History of HTN.  Negative CHADS2 values include Age > 20 years old.  The start date was 02/21/2004.  His last INR was 6.2 ratio and today's INR is 1.9.  Anticoagulation responsible provider: Bensimhon MD, Reuel Boom.  INR POC: 1.9.  Exp: 08/2010.    Anticoagulation Management Assessment/Plan:      The patient's current anticoagulation dose is Warfarin sodium 5  mg  tabs: as directed.  The target INR is 2 - 3.  The next INR is due 08/21/2009.  Anticoagulation instructions were given to patient.  Results were reviewed/authorized by Shelby Dubin PharmD, BCPS, CPP.  He was notified by Shelby Dubin PharmD, BCPS, CPP.         Prior Anticoagulation Instructions: INR 3.4  Skip 1 day then 1.5 tabs each Thursday and 1 tab on all other days.   Recheck in 10 days.   Current Anticoagulation Instructions: INR 1.9  Take 1.5 tabs today, then 1.5 tabs each Monday and Thursday and 1 tab on all other days.  Recheck in 2 - 3 weeks.

## 2010-08-04 NOTE — Letter (Signed)
Summary: Appointment - Missed  [facility], Main Office  [address]   [place], [place] [address]   Phone: [phone]  Fax: [phone]     August 25, 2009 MRN: [id number]   [facility] [address] [place], [place]  [address]   Dear Mr. BROMWELL,  Our records indicate you missed your appointment on 08/07/09 with Dr. Johney Frame. It is very important that we reach you to reschedule this appointment. We look forward to participating in your health care needs. Please contact us at the number listed above at your earliest convenience to reschedule this appointment.     Sincerely,   Ruel Favors Scheduling Team

## 2010-08-04 NOTE — Letter (Signed)
   [place] at [facility] [address] [place], [place]  [address]  [place] Phone: [phone]      February 03, 2010   [facility] [address] [place], [place] [address]  RE:  LAB RESULTS  Dear  Mr. GOETZKE,  The following is an interpretation of your most recent lab tests.  Please take note of any instructions provided or changes to medications that have resulted from your lab work.  DIABETIC STUDIES:  Fair - schedule a follow-up appointment Blood Glucose: 130   HgbA1C: 6.1    Your A1C, the diabetic lab we talked about is 6.1.  This puts you in the "pre-diabetic range".  Please work hard on diet, exercise, weight loss.  Avoid concentrated sweets.   We can discuss further at your upcoming visit later this month.   Sincerely Yours,    Lemont Fillers FNP  Appended Document:  Mailed.

## 2010-08-04 NOTE — Medication Information (Signed)
Summary: rov/tm  Anticoagulant Therapy  Managed by: Weston Brass, PharmD Referring MD: Valera Castle MD PCP: Dr. 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 no    Recent/future dental: no  Any missed doses?: no       Is patient compliant with meds? yes       Allergies: No Known Drug Allergies  Anticoagulation Management History:      The patient is taking warfarin and comes in today for a routine follow up visit.  Negative risk factors for bleeding include an age less than 59 years old.  The bleeding index is 'low risk'.  Positive CHADS2 values include History of CHF and History of HTN.  Negative CHADS2 values include Age > 51 years old.  The start date was 02/21/2004.  His last INR was 1.9.  Anticoagulation responsible provider: Juanda Chance MD, Smitty Cords.  INR POC: 3.6.  Cuvette Lot#: [id number].  Exp: 02/2011.    Anticoagulation Management Assessment/Plan:      The target INR is 2 - 3.  The next INR is due 01/01/2010.  Anticoagulation instructions were given to patient.  Results were reviewed/authorized by Weston Brass, PharmD.  He was notified by Weston Brass PharmD.         Prior Anticoagulation Instructions: INR 1.4 Continue 7.5mg s everyday except 5mg  on Sundays. Recheck in 7-10 days.   Current Anticoagulation Instructions: INR 3.6  Skip today's dose of Coumadin then decrease dose to 1 1/2 tablets every day except 1 tablet on Sunday and Wednesday

## 2010-08-04 NOTE — Medication Information (Signed)
Summary: rov/eac  Anticoagulant Therapy  Managed by: Elaina Pattee, PharmD Referring MD: Valera Castle MD PCP: Dr. 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 no    Recent/future dental: no  Any missed doses?: no       Is patient compliant with meds? yes       Allergies: No Known Drug Allergies  Anticoagulation Management History:      The patient is taking warfarin and comes in today for a routine follow up visit.  Negative risk factors for bleeding include an age less than 3 years old.  The bleeding index is 'low risk'.  Positive CHADS2 values include History of CHF and History of HTN.  Negative CHADS2 values include Age > 70 years old.  The start date was 02/21/2004.  His last INR was 1.9.  Anticoagulation responsible provider: Graciela Husbands MD, Viviann Spare.  INR POC: 1.9.  Cuvette Lot#: [id number].  Exp: 08/2010.    Anticoagulation Management Assessment/Plan:      The patient's current anticoagulation dose is Warfarin sodium 5 mg  tabs: as directed.  The target INR is 2 - 3.  The next INR is due 10/20/2009.  Anticoagulation instructions were given to patient.  Results were reviewed/authorized by Elaina Pattee, PharmD.  He was notified by Elaina Pattee, PharmD.         Prior Anticoagulation Instructions: INR 1.5  Take 1.5 tablets today.  Then start NEW dosing schedule of 1.5 tablets on Monday, Wednesday, and Friday, and 1 tablet all other days.  Return to clinic in 2 weeks.   Current Anticoagulation Instructions: INR 1.9. Take 1.5 tablets today, then take 1.5 tablets daily except 1 tablet Tues, Thurs, Sun.  Appended Document: [facility]    Anticoagulant  Therapy  Managed by: Elaina Pattee, PharmD Referring MD: Valera Castle MD PCP: Dr. 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  The bleeding index is 'low risk'.  Positive CHADS2 values include History of CHF and History of HTN.  Negative CHADS2 values include Age > 28 years old.  The start date was 02/21/2004.  His last INR was 1.9.  Anticoagulation responsible provider: Graciela Husbands MD, Viviann Spare.  Exp: 08/2010.    Anticoagulation Management Assessment/Plan:      The patient's current anticoagulation dose is Warfarin sodium 5 mg  tabs: as directed.  The target INR is 2 - 3.  The next INR is due 10/20/2009.  Anticoagulation instructions were given to patient.  Results were reviewed/authorized by Elaina Pattee, PharmD.         Prior Anticoagulation Instructions: INR 1.9. Take 1.5 tablets today, then take 1.5 tablets daily except 1 tablet Tues, Thurs, Sun.

## 2010-08-04 NOTE — Progress Notes (Signed)
Summary: Med List Brought by Patient  Med List Brought by Patient   Imported By: Lanelle Bal 02/12/2010 14:13:49  _____________________________________________________________________  External Attachment:    Type:   Image     Comment:   External Document

## 2010-08-04 NOTE — Medication Information (Signed)
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 no    Recent/future dental: no  Any missed doses?: no       Is patient compliant with meds? yes       Allergies: No Known Drug Allergies  Anticoagulation Management History:      The patient is taking warfarin and comes in today for a routine follow up visit.  Negative risk factors for bleeding include an age less than 86 years old.  The bleeding index is 'low risk'.  Positive CHADS2 values include History of CHF and History of HTN.  Negative CHADS2 values include Age > 37 years old.  The start date was 02/21/2004.  His last INR was 1.9.  Anticoagulation responsible provider: Clifton James MD, Cristal Deer.  INR POC: 2.4.  Cuvette Lot#: [id number].  Exp: 04/2011.    Anticoagulation Management Assessment/Plan:      The patient's current anticoagulation dose is Warfarin sodium 5 mg tabs: Take 1 tablet by mouth once a day.  The target INR is 2 - 3.  The next INR is due 03/26/2010.  Anticoagulation instructions were given to patient.  Results were reviewed/authorized by Eda Keys, PharmD.  He was notified by Harrel Carina, PharmD candidate.         Prior Anticoagulation Instructions: INR 5.0  Skip today and tomorrow's dose of Coumadin then decrease dose to 1 tablet every day except 1 1/2 tablets on Tuesday and Thursday.   Current Anticoagulation Instructions: INR 2.4  Continue taking 1 tablet every day except 1 1/2 tablets on Tuesdays and Thursdays. Re-check INR in 2 weeks.

## 2010-08-04 NOTE — Progress Notes (Signed)
Summary: Tramadol Refill  Phone Note Call from Patient Call back at Home Phone [phone]   Caller: Patient Call For: Lemont Fillers FNP Reason for Call: Refill Medication Summary of Call: patient was walk in to office on 8/18 requesting refill on Tramadol to [facility]. He states he is in Physical Therapy Initial call taken by: Glendell Docker CMA,  February 19, 2010 9:36 AM  Follow-up for Phone Call        Please instruct patient that the tramadol is only to be used for breakthrough pain not relieved by tylenol.  Not on a standing basis.  He should not use more than 3 tablets a day total.   It is too soon to refill his prescription.  Also,  Please let the patient know that I would like him to complete an  MRI of his spine to further evaluate.   Follow-up by: Lemont Fillers FNP,  February 19, 2010 11:47 AM  Additional Follow-up for Phone Call Additional follow up Details #1::        call placed to patient at [phone], no answer. A detailed voice message was left informing patient per Sandford Craze instructions. Patient was advised to call back if any questions Additional Follow-up by: Glendell Docker CMA,  February 19, 2010 12:31 PM  New Problems: LOW BACK PAIN, CHRONIC (ICD-724.2)   New Problems: LOW BACK PAIN, CHRONIC (ICD-724.2)

## 2010-08-04 NOTE — Progress Notes (Signed)
  Phone Note Outgoing Call   Call placed by: Lemont Fillers FNP,  February 22, 2010 1:21 PM Call placed to: Patient Summary of Call: Left message for patient to return my call to discuss MRI results. Initial call taken by: Lemont Fillers FNP,  February 22, 2010 1:21 PM  Follow-up for Phone Call        Patient returned my call, reviewed MRI results and plan for referral to Neurosurgery.  Discussed with patient tramadol only to use up to 3 tabs/day, 90/month max.  If longer term needs for pain meds will need to refer patient to pain management.  Pt aware. Follow-up by: Lemont Fillers FNP,  February 23, 2010 11:22 AM    New/Updated Medications: TRAMADOL HCL 50 MG TABS (TRAMADOL HCL) one tablet every 8 hours as needed for severe pain Prescriptions: TRAMADOL HCL 50 MG TABS (TRAMADOL HCL) one tablet every 8 hours as needed for severe pain  #90 x 0   Entered and Authorized by:   Lemont Fillers FNP   Signed by:   Lemont Fillers FNP on 02/23/2010   Method used:   Electronically to        [facility]* (retail)       [address] [place]       [place], [place]  [address]       Ph: [phone] or [phone]       Fax: [phone]   RxID:   [id number]

## 2010-08-04 NOTE — Letter (Signed)
Summary: [facility]  [facility]   Imported By: Lester Penuelas 08/12/2009 10:24:05  _____________________________________________________________________  External Attachment:    Type:   Image     Comment:   External Document

## 2010-08-04 NOTE — Medication Information (Signed)
Summary: rov/sp  Anticoagulant Therapy  Managed by: Weston Brass, PharmD Referring MD: Valera Castle MD PCP: Dr. 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 no    Recent/future dental: no  Any missed doses?: no       Is patient compliant with meds? yes       Allergies: No Known Drug Allergies  Anticoagulation Management History:      The patient is taking warfarin and comes in today for a routine follow up visit.  Negative risk factors for bleeding include an age less than 37 years old.  The bleeding index is 'low risk'.  Positive CHADS2 values include History of CHF and History of HTN.  Negative CHADS2 values include Age > 79 years old.  The start date was 02/21/2004.  His last INR was 1.9.  Anticoagulation responsible provider: Juanda Chance MD, Smitty Cords.  INR POC: 4.0.  Cuvette Lot#: [id number].  Exp: 09/12.    Anticoagulation Management Assessment/Plan:      The target INR is 2 - 3.  The next INR is due 01/15/2010.  Anticoagulation instructions were given to patient.  Results were reviewed/authorized by Weston Brass, PharmD.  He was notified by Weston Brass PharmD.         Prior Anticoagulation Instructions: INR 3.6  Skip today's dose of Coumadin then decrease dose to 1 1/2 tablets every day except 1 tablet on Sunday and Wednesday   Current Anticoagulation Instructions: INR 4.0  Skip today's dose of Coumadin then decrease dose to 1 1/ 2 tablets every day except 1 tablet on Tuesday, Thursday and Saturday.

## 2010-08-05 NOTE — Consult Note (Signed)
Summary: [facility]  [facility]   Imported By: Lanelle Bal 06/19/2010 11:34:34  _____________________________________________________________________  External Attachment:    Type:   Image     Comment:   External Document

## 2010-08-05 NOTE — Letter (Signed)
Summary: [facility]in  [address]   [place], [place] [address]   Phone: [phone]  Fax: [phone]     July 28, 2010 MRN: [id number]   [facility] [address] [place], [place]  [address]   Dear [name],  This letter is being sent to you as a reminder that it is necessary for you to get your INR/PT checked regularly so that we can optimize your care.  Our records indicate that you were scheduled to have a test done recently.  As of today, we have not received the results of this test.  It is very important that you have your INR checked.  Please call our office at the number listed above to schedule an appointment at your earliest convenience.    If you have recently had your protime checked or have discontinued this medication, please contact our office at the above phone number to clarify this issue.  Thank you for this prompt attention to this important health care matter.  Sincerely,   [facility] Cardiovascular [facility] Reduction [facility] Team

## 2010-08-05 NOTE — Letter (Signed)
Summary: Certification for PAP/[facility]  Certification for PAP/[employer]   Imported By: Sherian Rein 07/29/2010 15:04:05  _____________________________________________________________________  External Attachment:    Type:   Image     Comment:   External Document

## 2010-08-05 NOTE — Consult Note (Signed)
Summary: [facility]  [facility]   Imported By: Lanelle Bal 07/28/2010 08:28:49  _____________________________________________________________________  External Attachment:    Type:   Image     Comment:   External Document

## 2010-08-05 NOTE — Progress Notes (Signed)
Summary: needs appt  Phone Note Outgoing Call   Summary of Call: Please call patient and schedule a Medicare Wellness Visit. Initial call taken by: Lemont Fillers FNP,  July 19, 2010 12:32 PM  Follow-up for Phone Call        Left message on machine to return my call. Nicki Guadalajara Fergerson CMA Duncan Dull)  July 19, 2010 1:28 PM   Additional Follow-up for Phone Call Additional follow up Details #1::        Pt returned my call and scheduled appt for 07/28/10 @ 10am. Mervin Kung CMA (AAMA)  July 19, 2010 3:27 PM

## 2010-08-05 NOTE — Letter (Signed)
Summary: Recert for PAP/[employer]  Recert for PAP/[employer]   Imported By: Sherian Rein 07/29/2010 15:04:58  _____________________________________________________________________  External Attachment:    Type:   Image     Comment:   External Document

## 2010-08-05 NOTE — Progress Notes (Signed)
Summary: [facility] talk to nurse  Phone Note Call from Patient Call back at [phone]    Caller: michell ([employer]) Reason for Call: Talk to Nurse Complaint: Breathing Problems Summary of Call: [facility] would like to talk to a nurse about this patien. has several questions to ask about his oxygen and his PAP device Initial call taken by: Valinda Hoar,  June 21, 2010 11:58 AM  Follow-up for Phone Call        [place], spoke with Marcelino Duster with [employer].  She is requesting copy of sleep study and download from April 2011 for Medicare.  Above faxed to her attn at [phone] -- [facility] aware. Follow-up by: Gweneth Dimitri RN,  June 21, 2010 2:29 PM

## 2010-08-19 NOTE — Consult Note (Signed)
Summary: [facility]  [facility]   Imported By: Lanelle Bal 08/10/2010 11:49:52  _____________________________________________________________________  External Attachment:    Type:   Image     Comment:   External Document

## 2010-08-27 IMAGING — CR DG LUMBAR SPINE COMPLETE 4+V
5 series · 5 of 5 positions shown · non-contrast
Comparison: None.

CLINICAL DATA: Back pain

LUMBAR SPINE - COMPLETE 4+ VIEW

[t l-spine a.p. *]
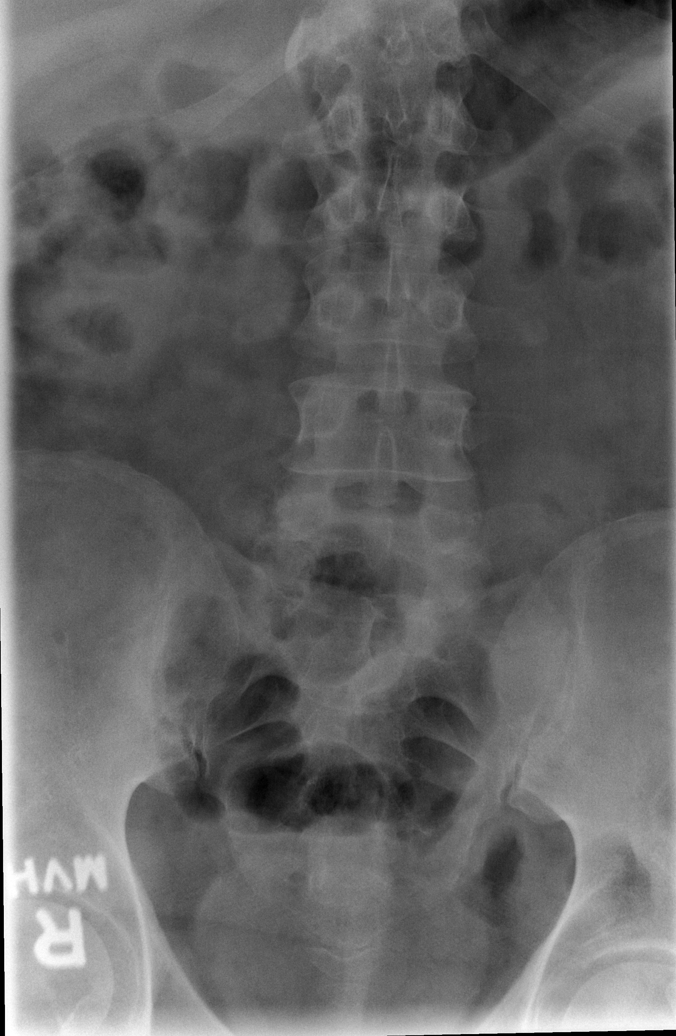

[t l-spine oblique exposure * (1 of 2)]
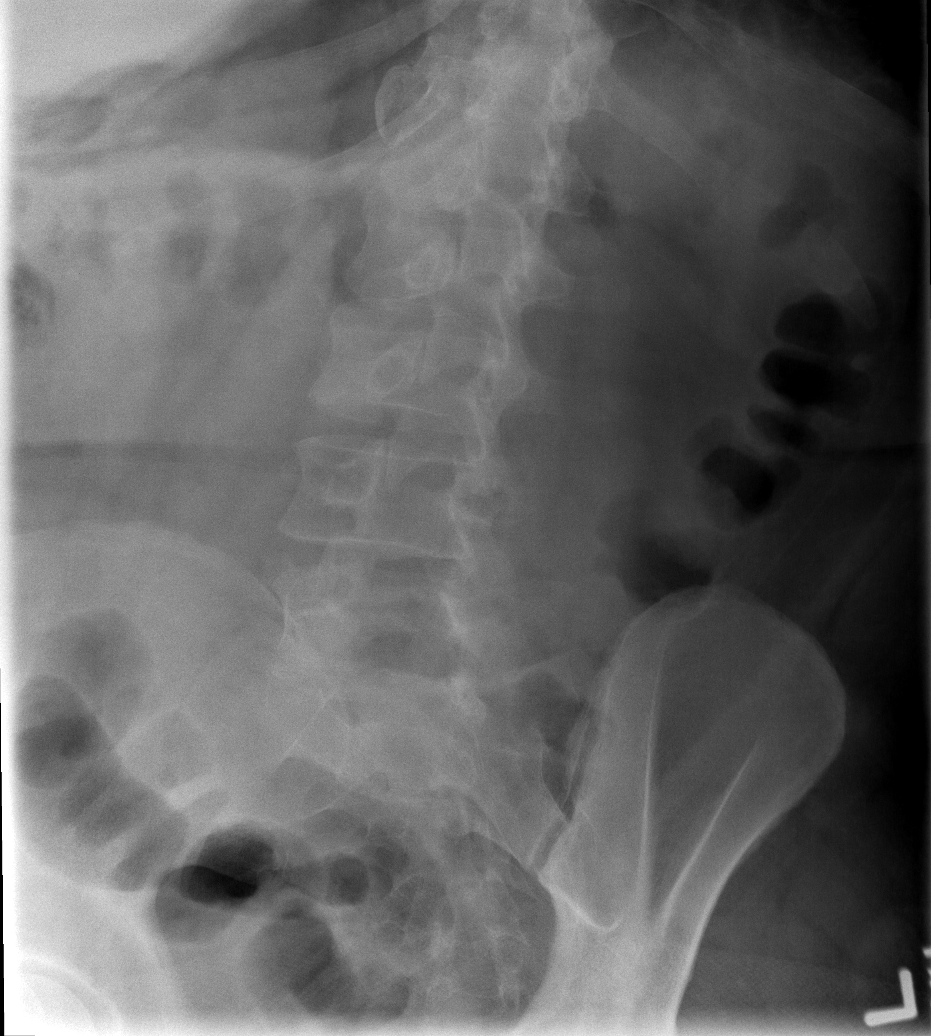

[t l-spine oblique exposure * (2 of 2)]
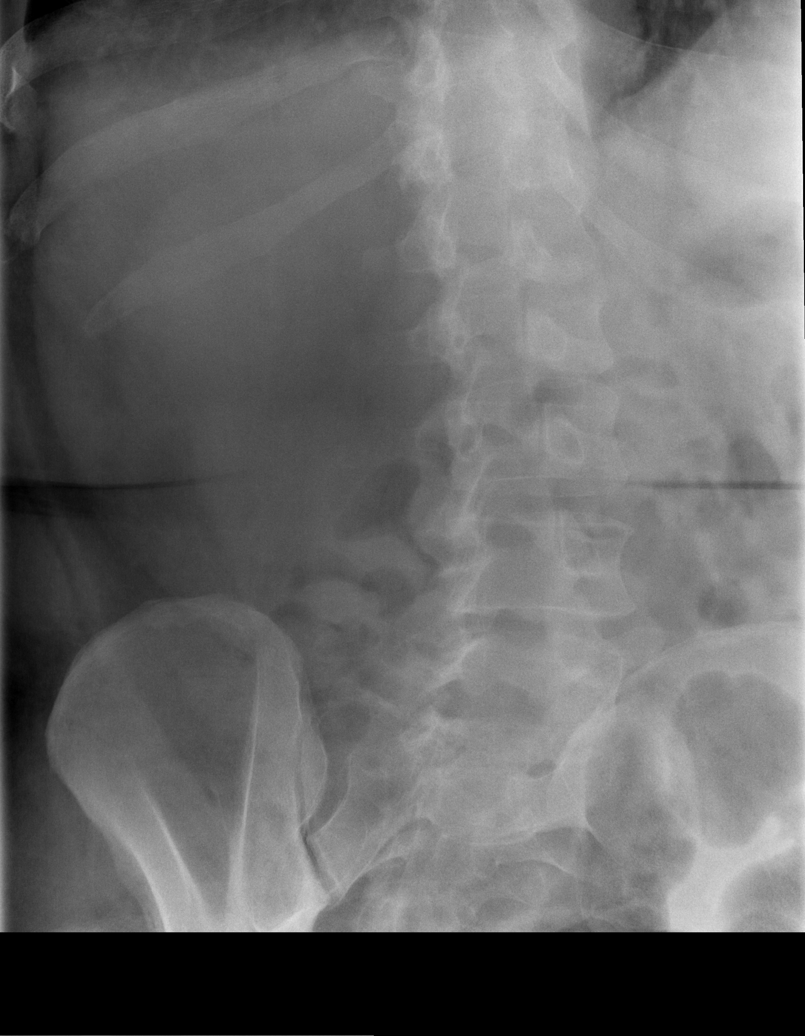

[t l-spine lat]
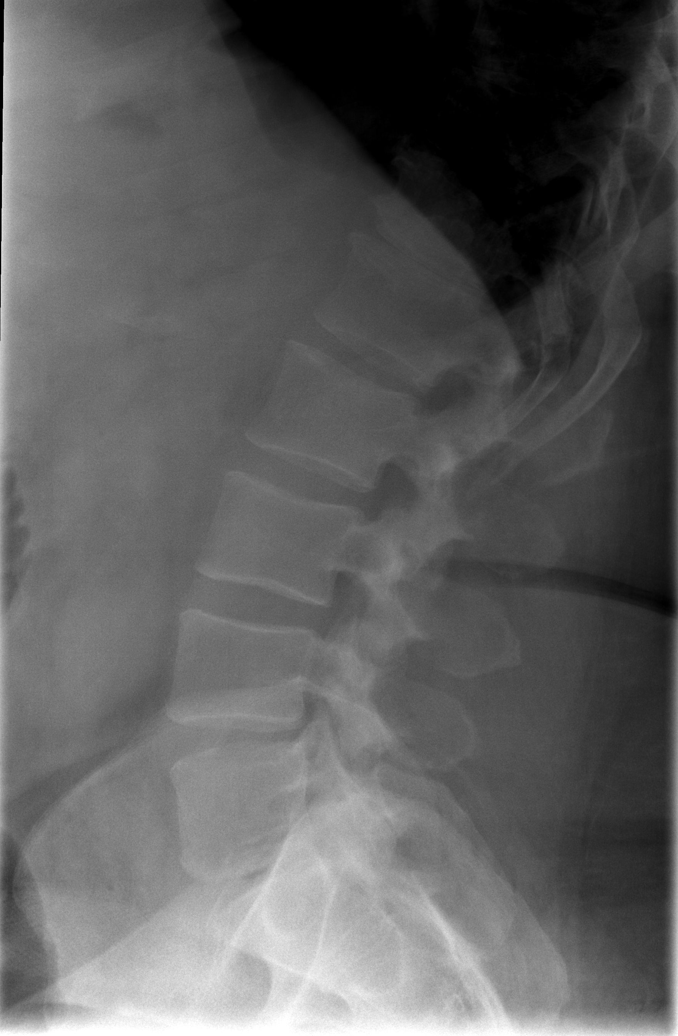

[t l-spine l5-s1 spot]
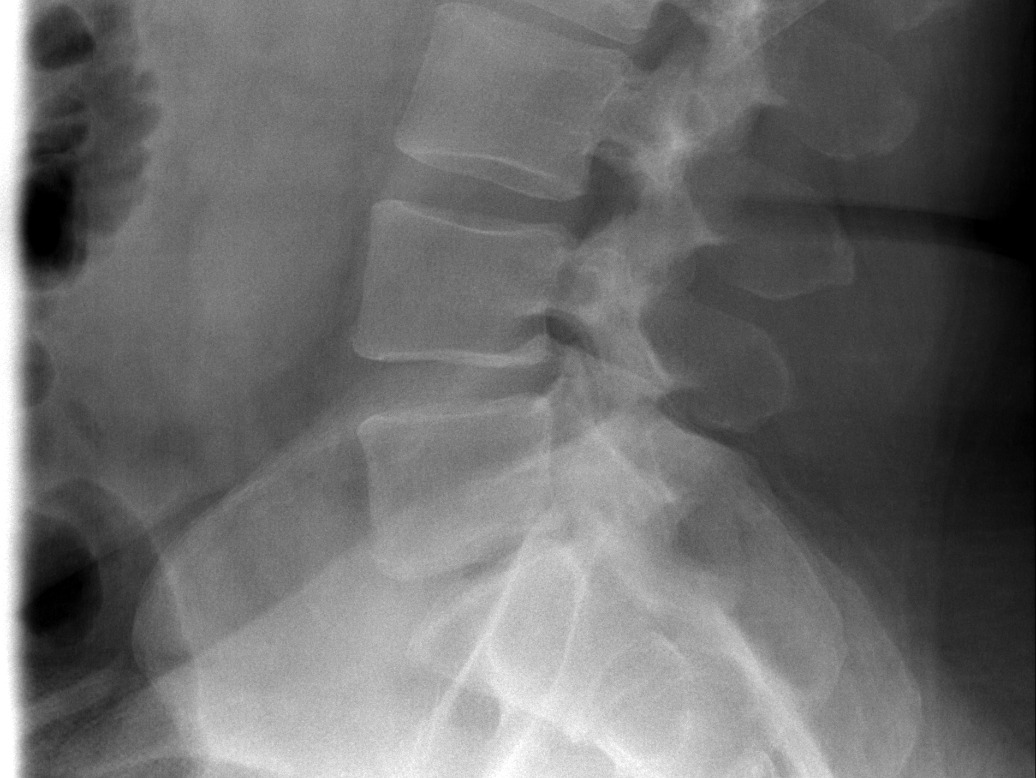

[5 of 5 positions shown; findings below may reference images not displayed]

FINDINGS: Normal lumbar spine alignment.  Mild lower thoracic
spondylosis and degenerative disc disease.  No acute compression
fracture, focal kyphosis or wedge-shaped deformity.  No pars
defects.  Intact pedicles.
IMPRESSION: No acute bony abnormality.

REF:G1 DICTATED: 08/04/2008 [DATE]

## 2010-08-30 ENCOUNTER — Encounter: Payer: Self-pay | Admitting: Internal Medicine

## 2010-08-30 DIAGNOSIS — I4891 Unspecified atrial fibrillation: Secondary | ICD-10-CM

## 2010-09-18 ENCOUNTER — Inpatient Hospital Stay (INDEPENDENT_AMBULATORY_CARE_PROVIDER_SITE_OTHER)
Admission: RE | Admit: 2010-09-18 | Discharge: 2010-09-18 | Disposition: A | Discharge status: Home / Self Care | Payer: Self-pay | Source: Ambulatory Visit | Attending: Family Medicine | Admitting: Family Medicine

## 2010-09-18 ENCOUNTER — Encounter: Payer: Self-pay | Admitting: Family Medicine

## 2010-09-18 DIAGNOSIS — S336XXA Sprain of sacroiliac joint, initial encounter: Secondary | ICD-10-CM

## 2010-09-19 LAB — CBC
HCT: 35.4 % — ABNORMAL LOW (ref 39.0–52.0)
HCT: 41.8 % (ref 39.0–52.0)
HCT: 45.6 % (ref 39.0–52.0)
Hemoglobin: 12.2 g/dL — ABNORMAL LOW (ref 13.0–17.0)
MCHC: 32.5 g/dL (ref 30.0–36.0)
MCHC: 33.5 g/dL (ref 30.0–36.0)
MCHC: 34.5 g/dL (ref 30.0–36.0)
MCV: 89.4 fL (ref 78.0–100.0)
MCV: 89.6 fL (ref 78.0–100.0)
MCV: 90.7 fL (ref 78.0–100.0)
MCV: 92.7 fL (ref 78.0–100.0)
MCV: 89.4 fL (ref 78.0–100.0)
Platelets: 156 10*3/uL (ref 150–400)
Platelets: 161 10*3/uL (ref 150–400)
Platelets: 166 10*3/uL (ref 150–400)
Platelets: 178 10*3/uL (ref 150–400)
RBC: 4.67 MIL/uL (ref 4.22–5.81)
RBC: 5.1 MIL/uL (ref 4.22–5.81)
RDW: 20 % — ABNORMAL HIGH (ref 11.5–15.5)
RDW: 20.2 % — ABNORMAL HIGH (ref 11.5–15.5)
WBC: 6.4 10*3/uL (ref 4.0–10.5)
WBC: 9.9 10*3/uL (ref 4.0–10.5)
WBC: 11.6 10*3/uL — ABNORMAL HIGH (ref 4.0–10.5)

## 2010-09-19 LAB — CULTURE, BLOOD (ROUTINE X 2): Culture: NO GROWTH

## 2010-09-19 LAB — BASIC METABOLIC PANEL
BUN: 18 mg/dL (ref 6–23)
CO2: 33 mEq/L — ABNORMAL HIGH (ref 19–32)
Calcium: 8.6 mg/dL (ref 8.4–10.5)
Chloride: 100 mEq/L (ref 96–112)
Chloride: 100 mEq/L (ref 96–112)
GFR calc Af Amer: >60 mL/min (ref >60)
GFR calc non Af Amer: >60 mL/min (ref >60)
GFR calc non Af Amer: >60 mL/min (ref >60)
GFR calc non Af Amer: >60 mL/min (ref >60)
Glucose, Bld: 102 mg/dL — ABNORMAL HIGH (ref 70–99)
Glucose, Bld: 107 mg/dL — ABNORMAL HIGH (ref 70–99)
Glucose, Bld: 108 mg/dL — ABNORMAL HIGH (ref 70–99)
Potassium: 3.8 mEq/L (ref 3.5–5.1)
Potassium: 3.9 mEq/L (ref 3.5–5.1)
Potassium: 4 mEq/L (ref 3.5–5.1)
Sodium: 138 mEq/L (ref 135–145)
Sodium: 138 mEq/L (ref 135–145)
Sodium: 143 mEq/L (ref 135–145)

## 2010-09-19 LAB — POCT CARDIAC MARKERS
CKMB, poc: 2.7 ng/mL (ref 1.0–8.0)
Troponin i, poc: <0.05 ng/mL (ref 0.00–0.09)

## 2010-09-19 LAB — URINALYSIS, ROUTINE W REFLEX MICROSCOPIC
Bilirubin Urine: NEGATIVE
Glucose, UA: NEGATIVE mg/dL
Ketones, ur: NEGATIVE mg/dL
Protein, ur: 30 mg/dL — AB

## 2010-09-19 LAB — CARDIAC PANEL(CRET KIN+CKTOT+MB+TROPI)
CK, MB: 2.8 ng/mL (ref 0.3–4.0)
Relative Index: INVALID (ref 0.0–2.5)
Relative Index: INVALID (ref 0.0–2.5)
Total CK: 69 U/L (ref 7–232)
Troponin I: 0.02 ng/mL (ref 0.00–0.06)
Troponin I: 0.04 ng/mL (ref 0.00–0.06)

## 2010-09-19 LAB — DIFFERENTIAL
Basophils Relative: 1 % (ref 0–1)
Eosinophils Absolute: 0 10*3/uL (ref 0.0–0.7)
Eosinophils Relative: 1 % (ref 0–5)
Eosinophils Relative: 0 % (ref 0–5)
Lymphocytes Relative: 10 % — ABNORMAL LOW (ref 12–46)
Lymphocytes Relative: 12 % (ref 12–46)
Lymphs Abs: 1.4 10*3/uL (ref 0.7–4.0)
Monocytes Absolute: 0.9 10*3/uL (ref 0.1–1.0)
Monocytes Relative: 8 % (ref 3–12)
Monocytes Relative: 6 % (ref 3–12)
Neutro Abs: 9.6 10*3/uL — ABNORMAL HIGH (ref 1.7–7.7)

## 2010-09-19 LAB — PROTIME-INR
INR: 2.3 — ABNORMAL HIGH (ref 0.00–1.49)
Prothrombin Time: 19 seconds — ABNORMAL HIGH (ref 11.6–15.2)

## 2010-09-19 LAB — HEPARIN LEVEL (UNFRACTIONATED)
Heparin Unfractionated: 0.45 IU/mL (ref 0.30–0.70)
Heparin Unfractionated: 0.55 IU/mL (ref 0.30–0.70)
Heparin Unfractionated: 0.65 IU/mL (ref 0.30–0.70)
Heparin Unfractionated: 0.79 IU/mL — ABNORMAL HIGH (ref 0.30–0.70)

## 2010-09-19 LAB — POCT B-TYPE NATRIURETIC PEPTIDE (BNP): B Natriuretic Peptide, POC: 1970 pg/mL — ABNORMAL HIGH (ref 0–100)

## 2010-09-19 LAB — URINE MICROSCOPIC-ADD ON

## 2010-09-19 LAB — APTT: aPTT: 36 seconds (ref 24–37)

## 2010-09-21 NOTE — Assessment & Plan Note (Signed)
Summary: lower back pain/TM rm 5   Vital Signs:  Patient Profile:   50 Years Old Male CC:      low back pain Height:     68 inches (177.80 cm) Weight:      407.50 pounds O2 Sat:      96 % O2 treatment:    Room Air Temp:     98.3 degrees F oral Pulse rate:   71 / minute Resp:     24 per minute BP sitting:   163 / 94  (left arm) Cuff size:   large  Pt. in pain?   yes    Location:   lower back    Intensity:   7-8    Type:       dull  Vitals Entered By: Clemens Catholic LPN (September 18, 2010 2:39 PM)                   Updated Prior Medication List: METOPROLOL TARTRATE 100 MG TABS (METOPROLOL TARTRATE) one tablet by mouth two times a day LISINOPRIL 10 MG  TABS (LISINOPRIL) Take one tablet twice daily * CPAP 13 CM   Current Allergies (reviewed today): No known allergies History of Present Illness Chief Complaint: low back pain History of Present Illness:  Subjective:  Patient had a flat tire yesterday, and while pulling a tire out of his car trunk, he felt a sudden pulling sensation in his lower back that has persisted.  The pain does not radiate, and he feels better when standing.  No bowel or bladder dysfunction.  No saddle numbness.  REVIEW OF SYSTEMS Constitutional Symptoms      Denies fever, chills, night sweats, weight loss, weight gain, and fatigue.  Eyes       Denies change in vision, eye pain, eye  discharge, glasses, contact lenses, and eye surgery. Ear/Nose/Throat/Mouth       Denies hearing loss/aids, change in hearing, ear pain, ear discharge, dizziness, frequent runny nose, frequent nose bleeds, sinus problems, sore throat, hoarseness, and tooth pain or bleeding.  Respiratory       Denies dry cough, productive cough, wheezing, shortness of breath, asthma, bronchitis, and emphysema/COPD.  Cardiovascular       Denies murmurs, chest pain, and tires easily with exhertion.    Gastrointestinal       Denies stomach pain, nausea/vomiting, diarrhea, constipation, blood in bowel movements, and indigestion. Genitourniary       Denies painful urination, kidney stones, and loss of urinary control. Neurological       Denies paralysis, seizures, and fainting/blackouts. Musculoskeletal       Complains of muscle pain and joint pain.      Denies joint stiffness, decreased range of motion, redness, swelling, muscle weakness, and gout.  Skin       Denies bruising, unusual mles/lumps or sores, and hair/skin or nail changes.  Psych       Denies mood changes, temper/anger issues, anxiety/stress, speech problems, depression, and sleep problems. Other Comments: pt states that he was picking up a spar tire out of his trunk yesterday and pulled his lower back. no OTC meds.    Past History:  Past Medical History: Reviewed history from 03/02/2009 and no changes required. 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 Abscess L axilla Cardioversion-- 05/2009  Family History: Reviewed history from 02/02/2010 and no changes required. Father: Living- DM, HTN, Stroke Mother: living- DM,HTN Siblings: sister HTN 2 sons-  alive and well  Social History: Reviewed history from 02/02/2010 and no changes required. disabled, Futures [place] , denies tobacco or ETOH 2 sons separated   Objective:  Morbidly obese middle-aged male in no acute distress   Lungs:  Clear to auscultation.  Breath sounds are equal.  Heart:  Regular rate and rhythm without murmurs, rubs, or gallops.  Abdomen:  Nontender without masses or hepatosplenomegaly.  Bowel sounds are present.  No CVA or flank tenderness.   Back:  Can heel/toe walk and squat without difficulty.   No midline tenderness.  There is tenderness over both SI joints.  Straight leg raising test is negative.  Sitting knee extension test is negative.  Strength and sensation in the lower extremities is normal.  Patellar and achilles reflexes are normal.    Skin:  no rash  Assessment New Problems: SACROILIAC STRAIN [phone])   Plan New Medications/Changes: TRAMADOL HCL 50 MG TABS (TRAMADOL HCL) One or two tabs by mouth Q4 to 6hr as needed for pain  #30 x 0, 09/18/2010, Donna Christen MD  New Orders: New Patient Level III [phone] Planning Comments:   Begin applying ice pack several times daily.  Begin Tramadol.  Begin stretching exercises in about 3 days ([facility] information and instruction patient handout given)  Followup with [facility] if not improved in two weeks.   The patient and/or caregiver has been counseled thoroughly with regard to medications prescribed including dosage, schedule, interactions, rationale for use, and possible side effects and they verbalize understanding.  Diagnoses and expected course of recovery discussed and will return if not improved as expected or if the condition worsens. Patient and/or caregiver verbalized understanding.   Prescriptions: TRAMADOL HCL 50 MG TABS (TRAMADOL HCL) One or two tabs by mouth Q4 to 6hr as needed for pain  #30 x 0   Entered and Authorized by:   Donna Christen MD   Signed by:   Donna Christen MD on 09/18/2010   Method used:   Print then Give to Patient   RxID:   [id number]   Orders Added: 1)  New Patient Level III [95188]

## 2010-10-09 LAB — LIPID PANEL
HDL: 30 mg/dL — ABNORMAL LOW (ref >39)
Total CHOL/HDL Ratio: 5.1 RATIO
Triglycerides: 117 mg/dL (ref <150)

## 2010-10-09 LAB — POCT CARDIAC MARKERS
CKMB, poc: 1.5 ng/mL (ref 1.0–8.0)
CKMB, poc: 2.2 ng/mL (ref 1.0–8.0)
Myoglobin, poc: 167 ng/mL (ref 12–200)
Myoglobin, poc: 208 ng/mL (ref 12–200)
Troponin i, poc: <0.05 ng/mL (ref 0.00–0.09)

## 2010-10-09 LAB — COMPREHENSIVE METABOLIC PANEL
ALT: 20 U/L (ref 0–53)
AST: 25 U/L (ref 0–37)
CO2: 36 mEq/L — ABNORMAL HIGH (ref 19–32)
Chloride: 96 mEq/L (ref 96–112)
Creatinine, Ser: 0.99 mg/dL (ref 0.4–1.5)
GFR calc Af Amer: >60 mL/min (ref >60)
GFR calc non Af Amer: >60 mL/min (ref >60)
Glucose, Bld: 180 mg/dL — ABNORMAL HIGH (ref 70–99)
Total Bilirubin: 0.6 mg/dL (ref 0.3–1.2)

## 2010-10-09 LAB — CBC
HCT: 38.9 % — ABNORMAL LOW (ref 39.0–52.0)
HCT: 39.8 % (ref 39.0–52.0)
Hemoglobin: 13.3 g/dL (ref 13.0–17.0)
Hemoglobin: 13.4 g/dL (ref 13.0–17.0)
MCHC: 33.4 g/dL (ref 30.0–36.0)
MCV: 89.5 fL (ref 78.0–100.0)
MCV: 89.8 fL (ref 78.0–100.0)
MCV: 89.8 fL (ref 78.0–100.0)
Platelets: 232 10*3/uL (ref 150–400)
RBC: 4.43 MIL/uL (ref 4.22–5.81)
RBC: 4.47 MIL/uL (ref 4.22–5.81)
RDW: 17.1 % — ABNORMAL HIGH (ref 11.5–15.5)
WBC: 10.4 10*3/uL (ref 4.0–10.5)
WBC: 11.3 10*3/uL — ABNORMAL HIGH (ref 4.0–10.5)
WBC: 8 10*3/uL (ref 4.0–10.5)

## 2010-10-09 LAB — CARDIAC PANEL(CRET KIN+CKTOT+MB+TROPI)
CK, MB: 2.6 ng/mL (ref 0.3–4.0)
CK, MB: 3.7 ng/mL (ref 0.3–4.0)
Relative Index: INVALID (ref 0.0–2.5)
Relative Index: INVALID (ref 0.0–2.5)
Total CK: 72 U/L (ref 7–232)
Troponin I: 0.25 ng/mL — ABNORMAL HIGH (ref 0.00–0.06)

## 2010-10-09 LAB — BASIC METABOLIC PANEL
BUN: 10 mg/dL (ref 6–23)
CO2: 31 mEq/L (ref 19–32)
Chloride: 102 mEq/L (ref 96–112)
Chloride: 92 mEq/L — ABNORMAL LOW (ref 96–112)
Creatinine, Ser: 0.87 mg/dL (ref 0.4–1.5)
Creatinine, Ser: 1.09 mg/dL (ref 0.4–1.5)
GFR calc Af Amer: >60 mL/min (ref >60)
Glucose, Bld: 120 mg/dL — ABNORMAL HIGH (ref 70–99)
Potassium: 3.4 mEq/L — ABNORMAL LOW (ref 3.5–5.1)
Potassium: 4.2 mEq/L (ref 3.5–5.1)
Sodium: 134 mEq/L — ABNORMAL LOW (ref 135–145)

## 2010-10-09 LAB — MAGNESIUM
Magnesium: 1.6 mg/dL (ref 1.5–2.5)
Magnesium: 1.9 mg/dL (ref 1.5–2.5)

## 2010-10-09 LAB — DIFFERENTIAL
Basophils Relative: 0 % (ref 0–1)
Eosinophils Absolute: 0.1 10*3/uL (ref 0.0–0.7)
Eosinophils Relative: 1 % (ref 0–5)
Lymphs Abs: 1 10*3/uL (ref 0.7–4.0)
Monocytes Absolute: 0.5 10*3/uL (ref 0.1–1.0)
Monocytes Relative: 4 % (ref 3–12)
Neutrophils Relative %: 87 % — ABNORMAL HIGH (ref 43–77)

## 2010-10-09 LAB — TROPONIN I
Troponin I: 0.03 ng/mL (ref 0.00–0.06)
Troponin I: 0.13 ng/mL — ABNORMAL HIGH (ref 0.00–0.06)

## 2010-10-09 LAB — CK TOTAL AND CKMB (NOT AT ARMC)
Relative Index: INVALID (ref 0.0–2.5)
Relative Index: INVALID (ref 0.0–2.5)

## 2010-10-09 LAB — HEMOGLOBIN A1C
Hgb A1c MFr Bld: 6.3 % — ABNORMAL HIGH (ref 4.6–6.1)
Mean Plasma Glucose: 134 mg/dL

## 2010-10-09 LAB — T4, FREE: Free T4: 0.97 ng/dL (ref 0.80–1.80)

## 2010-10-09 LAB — PROTIME-INR
INR: 3.6 — ABNORMAL HIGH (ref 0.00–1.49)
Prothrombin Time: 31.4 seconds — ABNORMAL HIGH (ref 11.6–15.2)

## 2010-10-19 LAB — CBC
HCT: 31.3 % — ABNORMAL LOW (ref 39.0–52.0)
HCT: 31.9 % — ABNORMAL LOW (ref 39.0–52.0)
HCT: 33.4 % — ABNORMAL LOW (ref 39.0–52.0)
HCT: 35 % — ABNORMAL LOW (ref 39.0–52.0)
HCT: 30.5 % — ABNORMAL LOW (ref 39.0–52.0)
HCT: 34.4 % — ABNORMAL LOW (ref 39.0–52.0)
HCT: 34.4 % — ABNORMAL LOW (ref 39.0–52.0)
HCT: 34.5 % — ABNORMAL LOW (ref 39.0–52.0)
HCT: 35.3 % — ABNORMAL LOW (ref 39.0–52.0)
Hemoglobin: 10.7 g/dL — ABNORMAL LOW (ref 13.0–17.0)
Hemoglobin: 10.9 g/dL — ABNORMAL LOW (ref 13.0–17.0)
Hemoglobin: 11.5 g/dL — ABNORMAL LOW (ref 13.0–17.0)
Hemoglobin: 11.6 g/dL — ABNORMAL LOW (ref 13.0–17.0)
Hemoglobin: 11.7 g/dL — ABNORMAL LOW (ref 13.0–17.0)
MCHC: 32.7 g/dL (ref 30.0–36.0)
MCHC: 32.9 g/dL (ref 30.0–36.0)
MCHC: 33.2 g/dL (ref 30.0–36.0)
MCHC: 33.4 g/dL (ref 30.0–36.0)
MCHC: 33.8 g/dL (ref 30.0–36.0)
MCHC: 33.3 g/dL (ref 30.0–36.0)
MCV: 91.1 fL (ref 78.0–100.0)
MCV: 91.6 fL (ref 78.0–100.0)
MCV: 91.9 fL (ref 78.0–100.0)
MCV: 92.5 fL (ref 78.0–100.0)
MCV: 92.5 fL (ref 78.0–100.0)
MCV: 89.5 fL (ref 78.0–100.0)
MCV: 89.5 fL (ref 78.0–100.0)
MCV: 89.6 fL (ref 78.0–100.0)
MCV: 90.7 fL (ref 78.0–100.0)
MCV: 91.3 fL (ref 78.0–100.0)
Platelets: 250 10*3/uL (ref 150–400)
Platelets: 254 10*3/uL (ref 150–400)
Platelets: 281 10*3/uL (ref 150–400)
Platelets: 292 10*3/uL (ref 150–400)
Platelets: 314 10*3/uL (ref 150–400)
Platelets: 106 10*3/uL — ABNORMAL LOW (ref 150–400)
Platelets: 141 10*3/uL — ABNORMAL LOW (ref 150–400)
Platelets: 233 10*3/uL (ref 150–400)
RBC: 3.32 MIL/uL — ABNORMAL LOW (ref 4.22–5.81)
RBC: 3.37 MIL/uL — ABNORMAL LOW (ref 4.22–5.81)
RBC: 3.45 MIL/uL — ABNORMAL LOW (ref 4.22–5.81)
RBC: 3.59 MIL/uL — ABNORMAL LOW (ref 4.22–5.81)
RBC: 3.61 MIL/uL — ABNORMAL LOW (ref 4.22–5.81)
RBC: 3.66 MIL/uL — ABNORMAL LOW (ref 4.22–5.81)
RBC: 3.52 MIL/uL — ABNORMAL LOW (ref 4.22–5.81)
RDW: 14.1 % (ref 11.5–15.5)
RDW: 14.6 % (ref 11.5–15.5)
RDW: 15 % (ref 11.5–15.5)
RDW: 15.1 % (ref 11.5–15.5)
RDW: 15.2 % (ref 11.5–15.5)
RDW: 14.8 % (ref 11.5–15.5)
RDW: 14.8 % (ref 11.5–15.5)
RDW: 15.2 % (ref 11.5–15.5)
WBC: 10.3 10*3/uL (ref 4.0–10.5)
WBC: 12.2 10*3/uL — ABNORMAL HIGH (ref 4.0–10.5)
WBC: 12.7 10*3/uL — ABNORMAL HIGH (ref 4.0–10.5)
WBC: 7.9 10*3/uL (ref 4.0–10.5)
WBC: 10.7 10*3/uL — ABNORMAL HIGH (ref 4.0–10.5)
WBC: 11.7 10*3/uL — ABNORMAL HIGH (ref 4.0–10.5)
WBC: 11.9 10*3/uL — ABNORMAL HIGH (ref 4.0–10.5)
WBC: 8.9 10*3/uL (ref 4.0–10.5)

## 2010-10-19 LAB — HEPATIC FUNCTION PANEL
Albumin: 3 g/dL — ABNORMAL LOW (ref 3.5–5.2)
Alkaline Phosphatase: 69 U/L (ref 39–117)
Total Protein: 6.8 g/dL (ref 6.0–8.3)

## 2010-10-19 LAB — COMPREHENSIVE METABOLIC PANEL
ALT: 23 U/L (ref 0–53)
AST: 17 U/L (ref 0–37)
Albumin: 3.1 g/dL — ABNORMAL LOW (ref 3.5–5.2)
Alkaline Phosphatase: 55 U/L (ref 39–117)
BUN: 23 mg/dL (ref 6–23)
CO2: 29 mEq/L (ref 19–32)
Calcium: 8.7 mg/dL (ref 8.4–10.5)
Chloride: 100 mEq/L (ref 96–112)
Creatinine, Ser: 1.1 mg/dL (ref 0.4–1.5)
GFR calc Af Amer: >60 mL/min (ref >60)
GFR calc non Af Amer: >60 mL/min (ref >60)
Glucose, Bld: 167 mg/dL — ABNORMAL HIGH (ref 70–99)
Potassium: 4.7 mEq/L (ref 3.5–5.1)
Sodium: 135 mEq/L (ref 135–145)
Total Bilirubin: 0.6 mg/dL (ref 0.3–1.2)
Total Protein: 7.3 g/dL (ref 6.0–8.3)

## 2010-10-19 LAB — PROTIME-INR
INR: 1.8 — ABNORMAL HIGH (ref 0.00–1.49)
INR: 2.1 — ABNORMAL HIGH (ref 0.00–1.49)
INR: 2.8 — ABNORMAL HIGH (ref 0.00–1.49)
INR: 3.6 — ABNORMAL HIGH (ref 0.00–1.49)
INR: 1.1 (ref 0.00–1.49)
INR: 1.2 (ref 0.00–1.49)
INR: 1.3 (ref 0.00–1.49)
Prothrombin Time: 15.1 seconds (ref 11.6–15.2)
Prothrombin Time: 21.4 seconds — ABNORMAL HIGH (ref 11.6–15.2)
Prothrombin Time: 24.8 seconds — ABNORMAL HIGH (ref 11.6–15.2)
Prothrombin Time: 31.8 seconds — ABNORMAL HIGH (ref 11.6–15.2)
Prothrombin Time: 36.6 seconds — ABNORMAL HIGH (ref 11.6–15.2)
Prothrombin Time: 38.9 seconds — ABNORMAL HIGH (ref 11.6–15.2)
Prothrombin Time: 45.9 seconds — ABNORMAL HIGH (ref 11.6–15.2)
Prothrombin Time: 15.1 seconds (ref 11.6–15.2)
Prothrombin Time: 16.5 seconds — ABNORMAL HIGH (ref 11.6–15.2)

## 2010-10-19 LAB — GLUCOSE, CAPILLARY
Glucose-Capillary: 107 mg/dL — ABNORMAL HIGH (ref 70–99)
Glucose-Capillary: 111 mg/dL — ABNORMAL HIGH (ref 70–99)
Glucose-Capillary: 117 mg/dL — ABNORMAL HIGH (ref 70–99)
Glucose-Capillary: 119 mg/dL — ABNORMAL HIGH (ref 70–99)
Glucose-Capillary: 119 mg/dL — ABNORMAL HIGH (ref 70–99)
Glucose-Capillary: 128 mg/dL — ABNORMAL HIGH (ref 70–99)
Glucose-Capillary: 137 mg/dL — ABNORMAL HIGH (ref 70–99)
Glucose-Capillary: 210 mg/dL — ABNORMAL HIGH (ref 70–99)
Glucose-Capillary: 214 mg/dL — ABNORMAL HIGH (ref 70–99)
Glucose-Capillary: 127 mg/dL — ABNORMAL HIGH (ref 70–99)
Glucose-Capillary: 134 mg/dL — ABNORMAL HIGH (ref 70–99)
Glucose-Capillary: 136 mg/dL — ABNORMAL HIGH (ref 70–99)
Glucose-Capillary: 140 mg/dL — ABNORMAL HIGH (ref 70–99)
Glucose-Capillary: 146 mg/dL — ABNORMAL HIGH (ref 70–99)
Glucose-Capillary: 147 mg/dL — ABNORMAL HIGH (ref 70–99)
Glucose-Capillary: 148 mg/dL — ABNORMAL HIGH (ref 70–99)
Glucose-Capillary: 148 mg/dL — ABNORMAL HIGH (ref 70–99)
Glucose-Capillary: 149 mg/dL — ABNORMAL HIGH (ref 70–99)
Glucose-Capillary: 150 mg/dL — ABNORMAL HIGH (ref 70–99)
Glucose-Capillary: 165 mg/dL — ABNORMAL HIGH (ref 70–99)
Glucose-Capillary: 166 mg/dL — ABNORMAL HIGH (ref 70–99)
Glucose-Capillary: 173 mg/dL — ABNORMAL HIGH (ref 70–99)
Glucose-Capillary: 173 mg/dL — ABNORMAL HIGH (ref 70–99)
Glucose-Capillary: 173 mg/dL — ABNORMAL HIGH (ref 70–99)
Glucose-Capillary: 194 mg/dL — ABNORMAL HIGH (ref 70–99)

## 2010-10-19 LAB — BASIC METABOLIC PANEL
BUN: 23 mg/dL (ref 6–23)
BUN: 31 mg/dL — ABNORMAL HIGH (ref 6–23)
BUN: 33 mg/dL — ABNORMAL HIGH (ref 6–23)
BUN: 8 mg/dL (ref 6–23)
BUN: 15 mg/dL (ref 6–23)
BUN: 26 mg/dL — ABNORMAL HIGH (ref 6–23)
BUN: 26 mg/dL — ABNORMAL HIGH (ref 6–23)
CO2: 31 mEq/L (ref 19–32)
CO2: 38 mEq/L — ABNORMAL HIGH (ref 19–32)
CO2: 39 mEq/L — ABNORMAL HIGH (ref 19–32)
CO2: 38 mEq/L — ABNORMAL HIGH (ref 19–32)
CO2: 40 mEq/L — ABNORMAL HIGH (ref 19–32)
CO2: 43 mEq/L — ABNORMAL HIGH (ref 19–32)
CO2: 43 mEq/L — ABNORMAL HIGH (ref 19–32)
Calcium: 8.7 mg/dL (ref 8.4–10.5)
Calcium: 8.7 mg/dL (ref 8.4–10.5)
Calcium: 8.9 mg/dL (ref 8.4–10.5)
Calcium: 9 mg/dL (ref 8.4–10.5)
Calcium: 9.5 mg/dL (ref 8.4–10.5)
Calcium: 9.8 mg/dL (ref 8.4–10.5)
Chloride: 103 mEq/L (ref 96–112)
Chloride: 91 mEq/L — ABNORMAL LOW (ref 96–112)
Chloride: 95 mEq/L — ABNORMAL LOW (ref 96–112)
Chloride: 96 mEq/L (ref 96–112)
Chloride: 97 mEq/L (ref 96–112)
Chloride: 82 mEq/L — ABNORMAL LOW (ref 96–112)
Chloride: 86 mEq/L — ABNORMAL LOW (ref 96–112)
Chloride: 91 mEq/L — ABNORMAL LOW (ref 96–112)
Creatinine, Ser: 0.79 mg/dL (ref 0.4–1.5)
Creatinine, Ser: 0.87 mg/dL (ref 0.4–1.5)
Creatinine, Ser: 0.93 mg/dL (ref 0.4–1.5)
Creatinine, Ser: 1.21 mg/dL (ref 0.4–1.5)
GFR calc Af Amer: >60 mL/min (ref >60)
GFR calc Af Amer: >60 mL/min (ref >60)
GFR calc Af Amer: >60 mL/min (ref >60)
GFR calc Af Amer: >60 mL/min (ref >60)
GFR calc Af Amer: >60 mL/min (ref >60)
GFR calc Af Amer: >60 mL/min (ref >60)
GFR calc Af Amer: >60 mL/min (ref >60)
GFR calc non Af Amer: >60 mL/min (ref >60)
GFR calc non Af Amer: >60 mL/min (ref >60)
GFR calc non Af Amer: >60 mL/min (ref >60)
GFR calc non Af Amer: >60 mL/min (ref >60)
Glucose, Bld: 107 mg/dL — ABNORMAL HIGH (ref 70–99)
Glucose, Bld: 127 mg/dL — ABNORMAL HIGH (ref 70–99)
Glucose, Bld: 130 mg/dL — ABNORMAL HIGH (ref 70–99)
Glucose, Bld: 134 mg/dL — ABNORMAL HIGH (ref 70–99)
Glucose, Bld: 142 mg/dL — ABNORMAL HIGH (ref 70–99)
Glucose, Bld: 150 mg/dL — ABNORMAL HIGH (ref 70–99)
Glucose, Bld: 168 mg/dL — ABNORMAL HIGH (ref 70–99)
Potassium: 4.1 mEq/L (ref 3.5–5.1)
Potassium: 4.7 mEq/L (ref 3.5–5.1)
Potassium: 4 mEq/L (ref 3.5–5.1)
Potassium: 4.1 mEq/L (ref 3.5–5.1)
Potassium: 4.2 mEq/L (ref 3.5–5.1)
Potassium: 4.4 mEq/L (ref 3.5–5.1)
Sodium: 134 mEq/L — ABNORMAL LOW (ref 135–145)
Sodium: 137 mEq/L (ref 135–145)
Sodium: 135 mEq/L (ref 135–145)
Sodium: 135 mEq/L (ref 135–145)
Sodium: 137 mEq/L (ref 135–145)
Sodium: 137 mEq/L (ref 135–145)

## 2010-10-19 LAB — BLOOD GAS, ARTERIAL
Acid-Base Excess: 1.5 mmol/L (ref 0.0–2.0)
Bicarbonate: 28 mEq/L — ABNORMAL HIGH (ref 20.0–24.0)
Drawn by: 103701
FIO2: 0.21 %
O2 Saturation: 92.1 %
Patient temperature: 98.3
TCO2: 26.3 mmol/L (ref 0–100)
pCO2 arterial: 56 mmHg — ABNORMAL HIGH (ref 35.0–45.0)
pH, Arterial: 7.318 — ABNORMAL LOW (ref 7.350–7.450)
pO2, Arterial: 65.6 mmHg — ABNORMAL LOW (ref 80.0–100.0)

## 2010-10-19 LAB — DIFFERENTIAL
Basophils Absolute: 0 10*3/uL (ref 0.0–0.1)
Basophils Relative: 0 % (ref 0–1)
Eosinophils Absolute: 0 10*3/uL (ref 0.0–0.7)
Eosinophils Relative: 0 % (ref 0–5)

## 2010-10-19 LAB — CARDIAC PANEL(CRET KIN+CKTOT+MB+TROPI)
CK, MB: 1 ng/mL (ref 0.3–4.0)
CK, MB: 1.4 ng/mL (ref 0.3–4.0)
Relative Index: INVALID (ref 0.0–2.5)
Relative Index: INVALID (ref 0.0–2.5)
Total CK: 42 U/L (ref 7–232)
Total CK: 43 U/L (ref 7–232)
Total CK: 53 U/L (ref 7–232)
Troponin I: 0.01 ng/mL (ref 0.00–0.06)
Troponin I: 0.02 ng/mL (ref 0.00–0.06)

## 2010-10-19 LAB — RETICULOCYTES
RBC.: 3.38 MIL/uL — ABNORMAL LOW (ref 4.22–5.81)
Retic Count, Absolute: 50.7 10*3/uL (ref 19.0–186.0)

## 2010-10-19 LAB — POCT CARDIAC MARKERS: Myoglobin, poc: 85.4 ng/mL (ref 12–200)

## 2010-10-19 LAB — IRON AND TIBC
Iron: 20 ug/dL — ABNORMAL LOW (ref 42–135)
Iron: 23 ug/dL — ABNORMAL LOW (ref 42–135)
Saturation Ratios: 6 % — ABNORMAL LOW (ref 20–55)
TIBC: 315 ug/dL (ref 215–435)
TIBC: 354 ug/dL (ref 215–435)
UIBC: 331 ug/dL

## 2010-10-19 LAB — APTT: aPTT: 65 seconds — ABNORMAL HIGH (ref 24–37)

## 2010-10-19 LAB — HEPARIN LEVEL (UNFRACTIONATED)
Heparin Unfractionated: 0.16 IU/mL — ABNORMAL LOW (ref 0.30–0.70)
Heparin Unfractionated: 0.35 IU/mL (ref 0.30–0.70)
Heparin Unfractionated: 0.33 IU/mL (ref 0.30–0.70)

## 2010-10-19 LAB — DIGOXIN LEVEL
Digoxin Level: 0.2 ng/mL — ABNORMAL LOW (ref 0.8–2.0)
Digoxin Level: 2.5 ng/mL — ABNORMAL HIGH (ref 0.8–2.0)

## 2010-10-19 LAB — BODY FLUID CULTURE

## 2010-10-19 LAB — CK: Total CK: 63 U/L (ref 7–232)

## 2010-10-19 LAB — VITAMIN B12
Vitamin B-12: 369 pg/mL (ref 211–911)
Vitamin B-12: 584 pg/mL (ref 211–911)

## 2010-10-19 LAB — VANCOMYCIN, TROUGH: Vancomycin Tr: 13.7 ug/mL (ref 10.0–20.0)

## 2010-10-19 LAB — FOLATE RBC: RBC Folate: 1077 ng/mL — ABNORMAL HIGH (ref 180–600)

## 2010-10-19 LAB — FERRITIN: Ferritin: 104 ng/mL (ref 22–322)

## 2010-10-19 LAB — PHOSPHORUS: Phosphorus: 3.6 mg/dL (ref 2.3–4.6)

## 2010-10-19 LAB — WOUND CULTURE: Gram Stain: NONE SEEN

## 2010-11-16 NOTE — Discharge Summary (Signed)
NAMERANARD, [name]               ACCOUNT NO.:  [id number]   MEDICAL RECORD NO.:  [id number]          PATIENT TYPE:  INP   LOCATION:  1411                         FACILITY:  [facility]   PHYSICIAN:  Pedro Earls, MD     DATE OF BIRTH:  13-Sep-1960   DATE OF ADMISSION:  08/09/2008  DATE OF DISCHARGE:  08/25/2008                               DISCHARGE SUMMARY   ADDENDUM:  Please see the detailed discharge summary dictated on  08/22/2008, by Dr. Hillery Aldo.   DISCHARGE MEDICATIONS:  The discharge medications remain the same, as  per the note.  Please see the reconciled list of medications given to  the patient on discharge.  The patient's Coumadin dosages were changed  to 5 mg p.o. daily, except for 10 mg x1 per week.  It will be managed by  [facility], who is keeping a close eye on the patient.      Pedro Earls, MD  Electronically Signed     NS/MEDQ  D:  08/25/2008  T:  08/25/2008  Job:  [id number]   cc:   Thomas C. Wall, MD, FACC  [address]  [address]  [place]  [place] [address]

## 2010-11-16 NOTE — Assessment & Plan Note (Signed)
[facility]                            CARDIOLOGY OFFICE NOTE   NAME:[name], [name]                        MRN:          [id number]  DATE:10/08/2007                            DOB:          [birth year]    [name] returns today for close followup after being admitted with atrial  fibrillation with rapid ventricular rate and decompensated a systolic  heart failure.  Please see the Discharge Summary from October 05, 2007.   We diuresed about 18 pounds in the hospital.  We also again  anticoagulated him which he has been noncompliant with.  In fact,  noncompliance has been a huge problem both with medication and  lifestyle..  We had a long discussion about this in the hospital.   He diuresed well and was anticoagulated.  Rate control was achieved with  IV diltiazem and IV beta blockade.   PROBLEM LIST:  1. Atrial fibrillation with rapid ventricular response.  2. Dilated nonischemic cardiomyopathy, ejection fraction 25-30%.  3. History of normal coronary arteries by catheterization February 26, 2004.  4. Cigar use.  5. Morbid obesity.  6. History of left atrial appendage thrombus in 2005.  7. Transient ocular ischemia affecting the left eye, August 2005.  8. Migraine headaches.  9. Impaired glucose tolerance.  10.Medication and lifestyle nonadherence.   He has no known drug allergies.   He was able to go home and enjoy Passover with his children who were  visiting from [place].  He is due a pro time today.   CURRENT MEDICATIONS:  1. Coumadin as directed.  2. Lopressor 50 mg p.o. b.i.d.  3. Lisinopril 10 mg p.o. b.i.d.  4. K-Dur 40 mEq b.i.d.  5. Lasix 120 mg b.i.d.  6. Xopenex MDI.   PHYSICAL EXAMINATION:  VITAL SIGNS:  His weight today is 403 which is  close to his baseline of 395.  His blood pressure 112/78.  His heart  rate apically is about 110-120. It was better controlled in the  hospital.  HEENT:  Unchanged.  NECK:  Carotids are full  bilaterally.  JVD could not be assessed.  LUNGS:  Clear to auscultation.  There are no rhonchi or wheezes.  HEART:  Reveals a poorly appreciated PMI.  He has a rapid rate and  rhythm.  ABDOMEN:  Morbidly obese.  Organomegaly could not be assessed.  EXTREMITIES:  Reveal chronic brawny changes, 1+ pitting edema which is  markedly improved from in the hospital.  Pulses were palpable.  There is  no sign of cellulitis. No sign of DVT.  NEUROLOGIC:  Exam is intact.  Affect is upbeat as usual.   EKG confirms atrial fibrillation with rapid ventricular rate.   ASSESSMENT AND PLAN:  [name] seems to be compliant over the short term.  I  have reinforced this again today which has been his major problem.  We  will check a pro time in the Coumadin Clinic and re-enroll him in the  Coumadin Clinic.  In addition, I will increase his Lopressor 100 mg p.o.  b.i.d. for better rate control.  We will check a Chem-7 to follow up on  his potassium.  We have made no other changes.  We will see him back in  10 days to 14 days for close followup.   If he fails to comply either with followup or with his Coumadin in the  future, we will ask him to find another cardiology practice.  I  discussed this quite candidly with him in the hospital, and we have a  good understanding.     Thomas C. Daleen Squibb, MD, [facility]  Electronically Signed    TCW/MedQ  DD: 10/08/2007  DT: 10/08/2007  Job #: [id number]

## 2010-11-16 NOTE — Assessment & Plan Note (Signed)
[facility]                            CARDIOLOGY OFFICE NOTE   NAME:[name], [name]                        MRN:          [id number]  DATE:10/22/2007                            DOB:          10-30-1960    [name] returns today for further management of his atrial fibrillation  with a rapid ventricular rate and decompensated systolic heart failure.  His weight is stable at 403.  He says he feels sluggish and lethargic.   His INR has been extremely high, in fact it was almost 10 this past  week.  He has been holding his medications since then.  He is having a  Pro Time done, today, with Dr. Jimmey Ralph.   CURRENT MEDS:  1. Lopressor 100 mg p.o. b.i.d.  2. Diltiazem XR 360 mg a day.  3. Lasix 120 mg p.o. b.i.d.  4. Potassium 3 b.i.d.  5. Lisinopril 10 mg b.i.d.  6. Coumadin as directed (it is currently on hold).   PHYSICAL EXAMINATION:  On his exam today is in no acute distress.  He  looks very comfortable actually.  His blood pressure is 132/78; his pulse is about 70-80 and irregular.  Weight is 403 and stable.  HEENT:  Unchanged.  NECK:  Shows no obvious JVD.  LUNGS:  Clear.  HEART:  Reveals an irregular rate and rhythm.  Soft S1-S2.  ABDOMEN EXAM:  Obese with good bowel sounds.  No obvious organomegaly.  EXTREMITIES:  Reveal 2+ chronic edema.  Pulses were present.   ASSESSMENT/PLAN:  [name] is currently stable, but is feeling lethargic and  tired.  This may be multifactorial, but it also could be his atrial  fibrillation.   We will check a Pro Time, today, and adjust his Coumadin accordingly.  I  have made no changes in his program.  His electrolytes were stable on  October 08, 2007, and we will not repeat these today with no med changes.  I will have him return in 4 weeks.  If he is still having a lot of  symptoms, at that time, we will set him up for an outpatient  cardioversion.     Thomas C. Daleen Squibb, MD, [facility]  Electronically Signed    TCW/MedQ   DD: 10/22/2007  DT: 10/22/2007  Job #: [id number]
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
## 2010-11-16 NOTE — Discharge Summary (Signed)
NAMEHARLAND, [name]               ACCOUNT NO.:  [id number]   MEDICAL RECORD NO.:  [id number]          PATIENT TYPE:  INP   LOCATION:  2007                         FACILITY:  [facility]   PHYSICIAN:  Jesse Sans. Wall, MD, FACCDATE OF BIRTH:  13-Jun-1961   DATE OF ADMISSION:  10/01/2007  DATE OF DISCHARGE:  10/05/2007                               DISCHARGE SUMMARY   PRIMARY CARDIOLOGIST:  Dr. Daleen Squibb.   PRIMARY CARE:  Dr. Leanne Chang, [place], [place].   DISCHARGE DIAGNOSIS:  Acute on chronic systolic congestive heart  failure.   SECONDARY DIAGNOSIS:  1. Atrial fibrillation with rapid ventricular response.  2. Dilated nonischemic cardiomyopathy EF of 25-35%.  3. History of normal coronary arteries by cardiac catheterization      February 26, 2004.  4. Ongoing tobacco abuse (cigars).  5. Morbid obesity.  6. History of left atrial appendage thrombus in 2005.  7. Transient ocular ischemia affecting left eye, August 2005.  8. Migraine headaches.  9. Impaired glucose tolerance.  10.Medication and lifestyle nonadherence.   ALLERGIES:  No known drug allergies.   PROCEDURES:  None.   HISTORY OF PRESENT ILLNESS:  A 50 year old Caucasian male with the above  problem list who was in his usual state of health until approximately 1  week prior to admission when he began to experience productive cough  with white sputum and was treated with Avelox as an outpatient.  Despite  this, he had worsening of cough as well as dyspnea and worsening lower  extremity edema.  He follow up with his primary [name] on March 30 in  hopes of getting something else for his bronchitis; however, he was  noted to be up 28 pounds in weight as well as tachycardic and in A fib  with rapid ventricle response at a rate of 148.  He was taken via EMS to  the ED for further evaluation. In the ED, he remained in atrial  fibrillation as was well as dyspneic and he was admitted for management  of acute on chronic systolic  congestive heart failure as well as PAF.   HOSPITAL COURSE:  Following admission, the patient was placed on  intravenous Lasix infusion and intravenous digoxin followed by oral  digoxin.  Despite this, we are unable to adequately rate control him and  diltiazem infusion was subsequently added.  He was also reinitiated on  heparin and Coumadin therapy.  The patient had been on Coumadin in the  past but taken off of it because of questions with regards to  compliance. At this point, we felt as the patient remained in atrial  fibrillation, that he would require Coumadin going forward. A course of  Avelox.  Throughout the hospitalization, he remained afebrile and Avelox  was subsequently discontinued.  With intravenous Lasix, he diuresed  slowly.   A 2-D echocardiogram was performed on March 31 and this showed very poor  imaging due to the patient's size.  With Lasix drip, we able to drop his  weight from 425 on admission to 405 today.  The patient has also  exhibited significant symptomatic improvement.  We were able to convert  him from intravenous diltiazem to oral diltiazem with reasonable rate  control at rest however, the patient remained tachycardiac with  activity.  [name] is fairly adamant about going home today.  We  have maximized his medications as best as we could at this point and  converted him from intravenous Lasix to oral Lasix. We will plan to  discharge him home this afternoon and stressed the importance of dietary  medication compliance.  His INR is therapeutic at 2.6 and we will plan  for him to follow up in our Coumadin clinic on Monday, April 6 at 2:15  p.m. [name] will be discharged home today in fair condition.   DISCHARGE LABS:  Hemoglobin 14.1, hematocrit 42.0, WBC 11.7, platelets  192, MCV 89.1, sodium 138, potassium 3.9, chloride 96, CO2 31, BUN 13,  creatinine 1.2, glucose 111, INR 2.6, total bilirubin 0.8. Alkaline  phosphatase 78, AST 20, ALT  28, albumin 3.2.  Cardiac markers negative  x4.  Total cholesterol 204, triglycerides 207, HDL 40, LDL 123, calcium  9.2.  BNP 126.0.  TSH 1.762.   DISPOSITION:  The patient is being discharged home today in good  condition.   FOLLOW-UP PLANS AND APPOINTMENTS:  He is to follow-up with Dr. Daleen Squibb on  April 6 at 1:30 p.m. He will then be seen in the Coumadin Clinic at  2:15.   DISCHARGE MEDICATIONS:  1. Coumadin 7.5 mg daily starting October 06, 2007.  Will hold Coumadin      today with an INR 2.6.  2. Diltiazem ER 360 mg daily.  3. Lasix 120 mg b.i.d.  4. Digoxin 0.25 mg daily.  5. K-Dur 40 mEq b.i.d.  6. Lopressor 50 mg b.i.d.  7. Xopenex MDI 45 mcg spray 2 puffs q.i.d. and q. 4 hours p.r.n.   OUTSTANDING LAB STUDIES:  None.   DURATION DISCHARGE ENCOUNTER:  60 minutes including physician time.      Nicolasa Ducking, ANP      Jesse Sans. Daleen Squibb, MD, [facility]  Electronically Signed    CB/MEDQ  D:  10/05/2007  T:  10/05/2007  Job:  [id number]   cc:   [facility]

## 2010-11-16 NOTE — Assessment & Plan Note (Signed)
 [facility]                            CARDIOLOGY OFFICE NOTE   NAME:[name], [name]                        MRN:          [id number]  DATE:05/07/2008                            DOB:          07-13-1960    [name] comes in today for followup of his chronic systolic heart failure,  chronic atrial fib, morbid obesity, and increasing pitting edema to the  point of weeping an increased risk of cellulitis.  Please see my  previous note.  I have placed him on metolazone 5 mg q.a.m.  He remains  on spironolactone 25 mg a day, Lasix 120 b.i.d.  He is also on ACE  inhibitor and potassium replacement.  He continues on anticoagulation.  Rate control is with Lanoxin, diltiazem, and Lopressor.   He has lost 19 pounds down to 428.  His lower extremity swelling is a  lot better and his weeping is resolved.  He has a little area on the  back of his left leg has now scabbed over.   PHYSICAL EXAMINATION:  VITAL SIGNS:  His blood pressure is 118/64, pulse  is 72 and irregular, weight is 428.  HEENT:  Unchanged.  LUNGS:  Clear to auscultation and percussion.  HEART:  Irregular rate and rhythm.  Soft S1 and S2.  ABDOMEN:  Morbidly obese.  Organomegaly could not be assessed.  EXTREMITIES:  Less edema, but still chronic brawny changes.  There is no  sign of weeping, no infection.  Pulses were palpable.   I am really pleased with diuresis from metolazone with [name].  We will  check a Chem-7.  I will see him back again in 3 months unless things  change.     Thomas C. Daleen Squibb, MD, [facility]  Electronically Signed    TCW/MedQ  DD: 05/07/2008  DT: 05/08/2008  Job #: [id number]

## 2010-11-16 NOTE — Assessment & Plan Note (Signed)
[facility]                            CARDIOLOGY OFFICE NOTE   NAME:Spain, ALEXZANDER                        MRN:          [id number]  DATE:03/07/2008                            DOB:          [birth year]    Mr. Hartinger comes in today for followup.  I saw him last on December 04, 2007.  His weight was up then and now is 20 pounds higher!  He says he  just has a real knack for holding onto fluid.   He denies any orthopnea, PND, tachy palpitations, presyncope, or  syncope.  He seems to be fairly compliant at least with his medications.   His current meds are unchanged since his last visit.  His diuretic  program is Lasix 120 mg p.o. b.i.d. and he is on potassium 60 b.i.d.   His blood pressure today is 122/74, his pulse is 78 and regular, and his  weight is 442 up 20.  He is in no acute distress.  Respiratory rate is  18.  Skin complexion is plethoric.  Otherwise his HEENT is unchanged and  is within normal limits.  Neck is remarkable for normal carotid  upstrokes.  There is no bruits.  There is no JVD.  Lungs are clear to  auscultation.  Heart reveals a poorly appreciated PMI.  Normal S1 and  S2, irregular rate and rhythm, well-controlled rate.  Abdominal exam is  morbidly obese.  Organomegaly could not be assessed.  Aorta could not be  assessed.  Extremities reveal 2+ pitting edema.  He has a lot of chronic  brawny changes.  Neuro exam is grossly intact.   I had a long talk with Vernia Buff today.  We are going to do something to  contain this weight gain and fluid retention.   After a long discussion, I have added spironolactone 25 mg a day.  He  will continue with the furosemide 120 b.i.d.  I really do not want to  increase this.  We will check a Chem-7 and see what his baseline BUN and  creatinine as well as potassium are today.  We will see him back for  closer followup in about 4-6 weeks.     Thomas C. Daleen Squibb, MD, [facility]  Electronically Signed    TCW/MedQ  DD: 03/07/2008  DT: 03/08/2008  Job #: [id number]
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
## 2010-11-16 NOTE — Discharge Summary (Signed)
NAMESAW, [name]               ACCOUNT NO.:  [id number]   MEDICAL RECORD NO.:  [id number]          PATIENT TYPE:  INP   LOCATION:  4708                         FACILITY:  [facility]   PHYSICIAN:  Hillis Range, MD       DATE OF BIRTH:  02-02-1961   DATE OF ADMISSION:  02/10/2009  DATE OF DISCHARGE:  02/13/2009                               DISCHARGE SUMMARY   ADDENDUM:  He will need to come back in 6 weeks for amiodarone therapy  that the patient is taking for both ventricular tachycardia suppression  and also possibly atrial fibrillation pharmacologic conversion.  First,  the patient is on amiodarone and he will be loaded as of about September  21.  I think we could use a 6-week follow up with the TSH and a liver  panel and that would run about that time, and I will set that up for  him.  He will also need perhaps to discuss with Dr. Johney Frame at the  meeting when he sees Dr. Johney Frame, Monday, August 30 at noon whether he  could be successfully cardioverted now that he has amiodarone on board.      [name], [place]      Hillis Range, MD  Electronically Signed    GM/MEDQ  D:  02/13/2009  T:  02/14/2009  Job:  [id number]

## 2010-11-16 NOTE — Assessment & Plan Note (Signed)
[facility]                            CARDIOLOGY OFFICE NOTE   NAME:[name], [name]                        MRN:          [id number]  DATE:04/09/2008                            DOB:          12-Aug-1960    [name] comes in today for further followup.  I saw him recently, we placed  him on spironolactone and hopes of improve diuresis on furosemide 120  b.i.d.  His chemistry that day was within normal limits including BUN  and creatinine as well as his potassium of 4.1.   He continues to gain weight, in fact, he has had several weeping areas  on the back and his left leg.  He says some days the Lasix works really  well and then another day it does not.   PHYSICAL EXAMINATION:  VITAL SIGNS:  His blood pressure today is 142/86,  his pulse was 90-100 and irregular, his weight is 447, up 5 more pounds.  He is in no acute distress.  Respiratory rate is 18, unlabored.  NECK:  JVD could not be assessed.  LUNGS:  Clear to auscultation and percussion.  HEART:  Regular rate and rhythm.  No gallop.  PMI could not be  appreciated.  ABDOMEN:  Morbidly obese and distended.  He has some pitting edema in  the lower part of his abdominal wall.  EXTREMITIES:  Reveal 3+ pitting edema.  There is no current weeping or  blisters.  There is one healing it is popped on the back of his left  leg.  Pulses were palpable with deep compression.  He has dependent  rubor.  No obvious DVT.  NEURO:  Intact.   I have had a long talk with [name].  I will place him on metolazone 5 mg  p.o. 30 minutes before his a.m. Lasix dose.  We will continue his  spironolactone as well as his potassium.  I will check a Chem-7 today.  I will see him back in about 3-4 weeks for followup.  Hopefully, he can  get some fluid off and avoid any blistering or cellulitis.     Thomas C. Daleen Squibb, MD, [facility]  Electronically Signed    TCW/MedQ  DD: 04/09/2008  DT: 04/10/2008  Job #: [id number]

## 2010-11-16 NOTE — Procedures (Signed)
[name], [name]               ACCOUNT NO.:  [id number]   MEDICAL RECORD NO.:  [id number]          PATIENT TYPE:  OUT   LOCATION:  SLEEP CENTER                 FACILITY:  [facility]   PHYSICIAN:  Oretha Milch, MD      DATE OF BIRTH:  [birth year]   DATE OF STUDY:  11/11/2008                            NOCTURNAL POLYSOMNOGRAM   REFERRING PHYSICIAN:   INDICATION FOR THE STUDY:  Excessive daytime somnolence and morbid  obesity with loud snoring and this 50 year old gentleman with a weight  of 419 pounds, height 5 feet 9 inches, BMI of 62, and neck size 20  inches.   EPWORTH SLEEPINESS SCORE:  10.   This intervention polysomnogram was performed with a sleep technologist  in attendance.  EEG, EOG, EMG, EKG, and respiratory parameters were  recorded.  Sleep stages, arousals with limb movements, and respiratory  data were scored according to criteria laid out by the American Academy  of Sleep Medicine.   SLEEP ARCHITECTURE:  Lights out was at 10:15 p.m., lights on was at 4:49  a.m.  Sleep apnea intervention was initiated at 1:08 a.m.  O2 was  initiated around 2:30 a.m.   During the baseline portion, total sleep time was 130 minutes with a  sleep period time 170 minutes and sleep efficiency of 75%.  Sleep  latency was 2 minutes.  Sleep stages of the percentage of total sleep  time was N1 18%, N2 82%, N3 0% slow wave, and REM sleep.   During the titration portion of REM sleep was seen and too long periods  around 2 a.m. and 3:30 a.m. and accounted for 71.5 minutes, was spent 50  minutes in supine sleep during the titration portion.   During the diagnostic portion, there were a total of 20 obstructive  apneas, 0 central apneas, 0 mixed apneas, and 53 hypopneas leading to an  apnea/hypopnea index of 33.6 events per hour, quite a few RERAs were  noted with an RDI of 49 events per hour.  Longest hypopnea was 32  seconds.   Due to this degree of respiratory disturbance, CPAP was initiated  at 5  cm and titrated to 13 cm to absence of events and snoring.  A  level of  10 cm for 20 minutes of sleep including 18 minutes of REM sleep, 3  obstructive apneas, 1 central apneas, and 2 hypopneas were noted with an  AHI of 18 events per hour and a low desaturation of 78%.  Hence, 1 L of  oxygen was blended.  At a final level of 13 cm for 118 minutes including  40 minutes of REM sleep, 4 central apneas, and 2 hypopneas were noted  with an AHI of 3 events per hour and a low desaturation of 89%.  This  appears to be the optimal level used during the study.   AROUSAL DATA:  The arousal index was 56 events per hour during the  diagnostic portion, and the final level of CPAP, 21 arousals were noted  with index of about 10 events per hour.   LIMB MOVEMENT DATA:  No significant limb movements were noted during the  diagnostic portion, but PLM seem to emerge during the titration portion  with a PLM index of 60 events per hour, PLM arousal index were 4 events  per hour.  The lowest oxygen desaturation was 78%, 1 L of oxygen had to  be blended in during the titration portion.  Lowest oxygen desaturation  with 1 L of oxygen on CPAP of 13 cm with 89%.   CARDIAC DATA:  Runs of atrial fibrillation were noted.  The low heart  rate was 35 beats per minute during the diagnostic portion and 31 beats  per minute during the titration portion.   DISCUSSION:  He was desensitized with a medium ResMed Mirage Quattro  mask, runs of atrial fibrillation were noted.  One liter of oxygen had  to be blended due to REM related desaturation   IMPRESSION:  1. Severe obstructive sleep apnea with hypopneas causing sleep      fragmentation and oxygen desaturation.  2. This was corrected by CPAP of 13 cm with medium full-face mask with      1 L of oxygen blended,  3. Runs of atrial fibrillation were noted.  4. No evidence of behavioral disturbance during sleep.  5. Significant PLM seem to emerge during the  titration portion, please      correlate clinically.   RECOMMENDATIONS:  1. CPAP should be initiated and titrated to a goal of 13 cm with a      medium full-face mask and 1 L of oxygen blended in.  2. He should asked to avoid medications with sedative side effects.      He should be asked to avoid driving when sleepy.  3. Please correlate clinically for history of restless leg syndrome.      Oretha Milch, MD  Electronically Signed     RVA/MEDQ  D:  11/13/2008 17:51:33  T:  11/14/2008 04:13:52  Job:  [id number]

## 2010-11-16 NOTE — Discharge Summary (Signed)
NAMEKHYAN, OATS               ACCOUNT NO.:  [id number]   MEDICAL RECORD NO.:  [id number]          PATIENT TYPE:  INP   LOCATION:  4708                         FACILITY:  [facility]   PHYSICIAN:  Hillis Range, MD       DATE OF BIRTH:  [birth year]   DATE OF ADMISSION:  02/10/2009  DATE OF DISCHARGE:  02/13/2009                               DISCHARGE SUMMARY   This patient has no known drug allergies.   Time for this dictation, examination, and explanation to the patient,  greater than 45 minutes.   FINAL DIAGNOSES:  1. Admitted with syncope in setting of tachyarrhythmia.      a.     Ventricular tachycardia per EMS, hemodynamically unstable,       requiring DC CV while patient awake.      b.     The patient went into atrial fibrillation after       cardioversion.      c.     Ventricular tachycardia suppression is effective with       amiodarone started on this admission.      d.     The patient continues in atrial fibrillation with excursions       into the 150s with activity.  2. Echocardiogram, February 11, 2009, systolic function moderately or      severely depressed, quantification difficult secondary to body      habitus.  3. Currently, the rate control will be      a.     Amiodarone 400 mg twice daily.      b.     Cardizem 240 mg daily.      c.     Metoprolol 125 mg twice daily.  4. The patient is hypokalemic this admission.  His Lasix dose is 120      mg in the morning, 18 mg at noon, and 120 mg in the evening but has      no supporting potassium.  Potassium supplementation added this      admission.   SECONDARY DIAGNOSES:  1. History of congestive heart failure, systolic.  2. Nonischemic cardiomyopathy, ejection fraction 25 to 30%.  3. Morbid obesity.  4. Hypertension.  5. Obstructive sleep apnea.  The patient uses CPAP.  6. Migraine headaches.  7. Chronic Coumadin.  8. Contusion of right chest with syncope, very difficult for pain      control.    Of note with the  history of syncope and nonischemic cardiomyopathy with  ejection fraction less than 35%, the patient could be a candidate for  ICD.  This was discussed in detail with the patient by Dr. Johney Frame.  The  patient has high risk given his morbid obesity which would put  implantation at risk as well.  The patient declines cardioverter-  defibrillator at this time.   BRIEF HISTORY:  [name] is a 50 year old male.  He has nonischemic  cardiomyopathy.   He was in usual state of health on the morning of August 10.  He has  been doing more walking than usual and he felt his  heart rate elevated.  He became diaphoretic, but he did not feel this was outlined with the  exertion level he was obtaining and the temperature outside.  He bent  over and after he bent over he had a brief prodrome and then lost  consciousness.  He did not have chest pain.  He did not feel his heart  rate changed substantially as he was already tachycardic from his  exertion.  He woke up on the ground with some injury to the right chest  wall.  He was complaining of weakness after he woke up.   The patient was aware that his heart rate was elevated, EMS was called,  and he was found to be in ventricular tachycardia.  He was hypotensive  and presyncopal.  They were unable to sedated him effectively and he was  cardioverted while still awake.  He converted to his underlying rhythm  atrial fibrillation.  He felt immediately much better after  cardioversion.  Currently on exam, he is resting quietly, comfortably  and complaining of some musculoskeletal pain from the fall.  He had a  previous episode in 2005.  No other history of syncope or presyncope.   HOSPITAL COURSE:  The patient admitted to [facility] with a  history of syncope secondary to ventricular tachycardia.  He required  direct cardioversion by EMS at the scene for hemodynamic instability.  He was treated with IV amiodarone and has continued on amiodarone  at  this admission.  This has effectively suppressed VT.  He currently at  discharge remains in atrial fibrillation with heart rate in the one  hundreds to one teens on augmented medical therapy which includes  amiodarone, Cardizem, and metoprolol now at 125 mg twice daily.  The  patient discharges today, August 13, with the admonition to drive for  the next 6 months.   MEDICATIONS AT DISCHARGE:  1. Enteric-coated aspirin 81 mg daily.  2. New dose of Coumadin 5 mg, Friday, August 13, and 3 mg daily,      starting August 14.  3. Amiodarone 200 mg tablets.      a.     2 tablets in the morning, 2 tablets in the evening from       Friday, August 13 to Sunday August 22.      b.     1 tablet in the morning, 1 tablet in the evening from       Monday, August 23 to September 20.      c.     1 tablet daily, starting September 21, Tuesday.  4. Medication is a new dose of metoprolol tartrate 100 mg tablets      twice daily and 50 mg tablets 1/2 tablet twice daily.  The      equivalent of 125 mg twice daily.  5. Cardizem CD 240 mg daily.  6. Lisinopril 10 mg twice daily.  7. Lasix 40 mg tablets, 3 tablets in the morning, 2 tablets at noon, 3      tablets in the evening.  8. Potassium chloride, a new medication 40 mEq 1 tablet twice daily.      He is asked to stop digoxin.  9. Vicodin HP 10 mg/660, 1 tablet every 4 hours as needed for pain.   LABORATORY STUDIES:  On the day of discharge, sodium 139, potassium 3.5,  chloride 96, carbonate 36, BUN is 11, creatinine 0.99, glucose 180,  hemoglobin is 13.4, hematocrit 40, white cells are 8, platelets  of 206,  INR on discharge 2.5.      [name], [place]      Hillis Range, MD  Electronically Signed    GM/MEDQ  D:  02/13/2009  T:  02/14/2009  Job:  [id number]   cc:   Thomas C. Daleen Squibb, MD, [facility]  Lonia Blood, M.D.

## 2010-11-16 NOTE — H&P (Signed)
[name], [name] NO.:  [name]   [name] RECORD NO.:  [name]          PATIENT TYPE:  INP   LOCATION:  2920                         FACILITY:  [facility]   PHYSICIAN:  Nicolasa Ducking, ANP DATE OF BIRTH:  06-23-1961   DATE OF ADMISSION:  10/01/2007  DATE OF DISCHARGE:                              HISTORY & PHYSICAL   PRIMARY CARDIOLOGIST:  Dr. Daleen Squibb.   PRIMARY CARE PHYSICIAN:  Dr. Leanne Chang, [address], [place].   PATIENT PROFILE:  A 50 year old Caucasian male with prior history of PAF  and dilated cardiomyopathy presents with acute on chronic systolic heart  failure as well as paroxysmal atrial fibrillation with rapid ventricular  response.   PROBLEMS:  1. History of cardiac arrest August 2005.  Catheterization February 26, 2004, showing normal coronary arteries with EF 25%.  Images were      limited to proximal vessels secondary to his size.  2. Dilated nonischemic cardiomyopathy/chronic systolic congestive      heart failure.  March 16, 2005, 2-D echocardiogram, EF 25-35%,      severe diffuse LV hypokinesis, mildly dilated left atrium.  3. Paroxysmal atrial fibrillation.  4. Previous chronic Coumadin anticoagulation, recently discontinued.  5. Ongoing tobacco abuse, smoking between one to three cigars a couple      times a week.  6. Morbid obesity.  7. History of left atrial appendage thrombus in August 2005.  8. Transient ocular ischemia affecting the left eye August 2005.  9. Migraine headaches.  10.Impaired glucose tolerance.  11.Medication and lifestyle nonadherence.   HISTORY OF PRESENT ILLNESS:  A 50 year old Caucasian male with history  of paroxysmal atrial fibrillation, dilated nonischemic cardiomyopathy  and noncompliance.  He notes that he had been doing well until  approximately 10 days ago when he began to experience productive cough  with white sputum.  One week ago, he saw his primary care [name] and  was given a  prescription for Avelox which he has now completed.  He  noted no change in cough and actually worsening lower extremity edema as  well as dyspnea on exertion.  He has chronic stable 2-3 pillow  orthopnea.  Denies any PND, presyncope, syncope or worsening of his  abdominal girth.  I saw his primary care [name] again today in hopes  of getting something else for his bronchitis and today his weight was up  28 pounds (425 from 397 over the past weekend), and I was noted to be  tachycardic and irregular.  ECG was performed showing a fib with RVR at  a rate of 148.  He was taken from the office via EMS to the ED.  Here he  is in no acute distress, and offers no complaints.   ALLERGIES:  NO KNOWN DRUG ALLERGIES.   HOME MEDICATIONS:  1. Lopressor 50 mg b.i.d.  2. Lasix 40 mg q.a.m.   FAMILY HISTORY:  Mother is alive at age 3 of hypertension, diabetes.  Father is alive at age 31 with diabetes, hypertension, CVA.  He has a  sister who is alive and well, but  is obese.   SOCIAL HISTORY:  Lives in [place] with his parents.  He is currently  unemployed.  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  He smokes about 1-3 cigars a couple of times a week.  Appears to have been doing this for 7 years.  Denies any alcohol or  drugs.  Does not routinely exercise.   REVIEW OF SYSTEMS:  Positive for diaphoresis.  He has productive cough  with white sputum and also feels as though his left ear is a clogged.  He has chronic two to three pillow orthopnea which is no worse over the  past 10 days, dyspnea on exertion and some dyspnea at rest over the past  couple of days as well as worsening lower extremity edema.  Otherwise,  all systems reviewed and negative.   PHYSICAL EXAMINATION:  VITAL SIGNS:  Temperature 97.9, heart rate 119,  respirations 18, blood pressure 134/97, pulse ox 95% on room air.  GENERAL:  Pleasant white male in no acute distress.  Awake, alert and   oriented x3.  HEENT:  Normal.  Nares grossly intact, nonfocal.  SKIN:  Warm and dry with chronic venous stasis changes of the lower  extremities bilaterally.  NECK:  Obese and difficult to assess JVP.  No bruits.  LUNGS:  Respirations regular and unlabored with diminished breath sounds  and inspiratory and expiratory wheezing throughout.  CARDIAC:  Irregular irregular S1-S2.  He is tachycardiac, but heart  sounds are distant.  ABDOMEN:  Obese, soft, nontender, nondistended.  Bowel sounds x4.  EXTREMITIES:  Warm, dry, pink with venous stasis changes.  There is 3+  bilateral lower extremity edema.   STUDIES:  Chest x-ray is pending.  EKG is pending.  Lab work is pending.   ASSESSMENT:  1. Acute on chronic systolic congestive heart failure/nonischemic      cardiomyopathy.  Plan to admit and diurese with Lasix drip.  Will      follow-up chest x-ray, B-met and BNP as these are performed.      Consider adding back ACE, although this was reportedly discontinued      secondary to abnormal renal function.  Will also see what his B-met      looks like first and in the meantime add hydralazine and nitrates      for afterload reduction.  Will hold off on beta blocker at this      point as he is actively wheezing and is volume overloaded.  When      adding back, consider switching him from Lopressor to Coreg.  The      patient needs a heart failure education with regards to diet and      compliance.  2. Atrial fibrillation with RVR.  Question duration.  Add digoxin,      heparin and Coumadin.  Unclear why his Coumadin was discontinued in      the past.  Check B-met, magnesium and TSH.  3. Tobacco abuse, cessation advised.  4. Obesity.  The patient needs some form of outpatient rehab to assist      him with weight loss.      Nicolasa Ducking, ANP     CB/MEDQ  D:  10/01/2007  T:  10/02/2007  Job:  [id number]

## 2010-11-16 NOTE — H&P (Signed)
NAMEESVIN, HNAT               ACCOUNT NO.:  [id number]   MEDICAL RECORD NO.:  [id number]          PATIENT TYPE:  INP   LOCATION:  2919                         FACILITY:  [facility]   PHYSICIAN:  Hillis Range, MD       DATE OF BIRTH:  Aug 30, 1960   DATE OF ADMISSION:  02/10/2009  DATE OF DISCHARGE:                              HISTORY & PHYSICAL   PRIMARY CARE PHYSICIAN:  Lonia Blood, MD   PRIMARY CARDIOLOGIST:  Jesse Sans. Wall, MD, [facility]   CHIEF COMPLAINT:  Syncope secondary to ventricular tachycardia.   HISTORY OF PRESENT ILLNESS:  Mr. Eisenhour is a 50 year old male with  known nonischemic cardiomyopathy.  He was in his usual state of health  today.  He had not eaten breakfast.  He was doing more walking than  usual and felt his heart rate elevated and he was diaphoretic, but did  not feel that this was out of line with his exertion level and ambient  temperature greater than 90 degrees.  He bent over and after he bent  over he had a brief prodrome and then lost consciousness.  He had no  chest pain.  He did not feel his heart rate changed substantially as he  was already tachycardic from the exertion.  He woke up on the ground  with some injuries and was complaining of weakness after he woke up.  His shortness of breath has not changed and he still did progression and  he was aware that his heart rate was elevated.  EMS was called and he  was in ventricular tachycardia.  He was hypotensive and presyncopal, so  he was cardioverted x1 with 100 joules.  He was converted into his  underlying rhythm of AFib.  The patient states that once he was  cardioverted he felt immediately much better.  Currently, he is resting  comfortably and complaining only of some musculoskeletal pain from the  fall.  His only previous episode was in 2005.  He has no other history  of syncope or presyncope.   PAST MEDICAL HISTORY:  1. Permanent atrial fibrillation, diagnosed in 2005.  2. Hypertension.  3.  Morbid obesity with a body mass index of 58.5.  4. Chronic anticoagulation with Coumadin.  5. Chronic systolic congestive heart failure.  6. Nonischemic cardiomyopathy with an EF of 25-30% by echocardiogram      in 2006.  7. Cardiac catheterization by radial approach in 2005 showing no      significant proximal stenosis on the coronary arteries and an EF of      25%.  8. Obstructive sleep apnea, on CPAP.  9. History of migraines.  10.History of transient ocular ischemia on the left eye.  11.History of syncope in 2005 at which time he received CPR and the      initial rhythm on the monitor was AFib.   PAST SURGICAL HISTORY:  He is status post cardiac catheterization and  abscess removal from the left axilla.   ALLERGIES:  No known drug allergies.   CURRENT MEDICATIONS:  1. Lanoxin 0.25 mg daily.  2. Lasix  80 mg 1-1/2 tablets in a.m. and p.m. with 1 tablet at noon.  3. Lisinopril 10 mg b.i.d.  4. Lopressor 50 mg b.i.d.  5. Cardizem CD 240 mg a day.  6. Coumadin 5 mg daily.  7. Tramadol 50 mg p.r.n.   SOCIAL HISTORY:  He lives in [place] with his parents.  He is  disabled secondary to his AFib.  He denies history of alcohol, tobacco,  or drug abuse.   FAMILY HISTORY:  Both of his parents are alive in their 60s and neither  of his parents nor any siblings have coronary artery disease and rhythm  issues.   REVIEW OF SYSTEMS:  His weight is down about 35 pounds in the last 6  months.  He feels that he is eating healthier now.  He denies caffeine  abuse.  He has chronic dyspnea on exertion.  He has chronic lower  extremity edema, but this has improved.  He had tachy palpitations all  day prior to the syncopal episode.  He has not been coughing or  wheezing.  He has chronic arthralgias and is currently experiencing  right rib pain from the fall.  He has some anxiety and possibly some  depression.  Full 14-point review of systems is otherwise negative.   PHYSICAL EXAMINATION:   VITAL SIGNS:  Temperature is 98.1, blood pressure  102/50, pulse 105, and respiratory rate 12.  GENERAL:  He is a well-developed, obese white male in no acute distress.  HEENT:  Normal.  NECK:  There is no lymphadenopathy, thyromegaly, bruit, or JVD noted.  CV:  His heart is irregular in rate and rhythm with an S1 and S2 and no  significant murmur, rub, or gallop is noted.  Distal pulses are intact  in all 4 extremities.  LUNGS:  Clear to auscultation bilaterally.  SKIN:  No rashes or lesions are noted.  ABDOMEN:  Soft and nontender with active bowel sounds.  EXTREMITIES:  He has 1+ edema, but no cyanosis or clubbing is noted.  The skin on his lower extremities is chronically thickened and darkened.  MUSCULOSKELETAL:  He has no joint deformity or effusions, but the right  lateral rib area is tender.  He has got no spine or CVA tenderness.  NEURO:  He is alert and oriented.  Cranial Nerves II through XII grossly  intact.   Chest x-ray shows cardiomegaly with no acute infiltrate or edema, right  rib films pending.   EKG is VT rate of approximately 260 and converted to atrial  fibrillation, rate approximately 100, but no acute ischemic changes are  noted.   LABORATORY VALUES:  Dig level 0.7.  Hemoglobin 12.9, hematocrit 38.9,  WBCs 11.3, and platelets 232.  Sodium 138, potassium 4.2, chloride 102,  CO2 of 28, BUN 10, creatinine 1.09, and glucose 120.  CK-MB and troponin  I negative x1.  INR 3.4.   IMPRESSION:  Mr. Destin was seen today by Dr. Johney Frame.  He has a  history of morbid obesity, permanent atrial fibrillation, and  nonischemic cardiomyopathy who is now admitted for syncope in the  setting of wide complex tachycardia.  Review of strips suggest likely  ventricular tachycardia.  This could possibly be aberrant  supraventricular tachycardia (unlikely).  He will be admitted to [facility].  We will cycle cardiac enzymes.  He will be kept on IV amiodarone  overnight and we will  continue his current home medications.  A 2-D  echocardiogram will be ordered.  We  will consider heart catheterization  if he rules in for MI.  A decision will be  made regarding an ICD once the above data are evaluated.  We will hold  his Coumadin for now.  Of note, his systolic blood pressure was in the  80s upon arrival to the emergency room, but he has received a fluid  bolus and this has improved.  He will be continued on CPAP for his  obstructive sleep apnea.      Theodore Demark, PA-C      Hillis Range, MD  Electronically Signed    RB/MEDQ  D:  02/10/2009  T:  02/10/2009  Job:  [id number]
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
## 2010-11-16 NOTE — Assessment & Plan Note (Signed)
[facility]                            CARDIOLOGY OFFICE NOTE   NAME:[name] [name]                        MRN:          [id number]  DATE:12/04/2007                            DOB:          15-Sep-1960    [name] [name] comes in today for followup of his atrial fibrillation, rapid  ventricular response, and decompensated systolic heart failure as well  as morbid obesity.   He is now apparently being compliant with his Coumadin.  He has,  unfortunately, gained 19 pounds since I saw him 6 weeks ago. He is up to  422.  He says the swelling got really bad, but he does not know why.  His Lasix dose has not changed.  He says now the swelling is down.  Dietary issues are always a concern with him, unfortunately.   CURRENT MEDICATIONS:  1. Coumadin.  2. Lisinopril 10 mg p.o. 3 times a day.  3. Potassium 3 b.i.d.  4. Diltiazem extended release 360 mg.  5. Lopressor 100 mg p.o. b.i.d.  6. Lasix 120 b.i.d.  7. Lanoxin 0.25 mg a day.   He looks good today.  He is having no symptoms of palpitations or chest  discomfort.  He does have dyspnea on exertion when he walks his dog.   PHYSICAL EXAMINATION:  VITAL SIGNS:  Blood pressure 114/75.  His pulse  is 80 and irregular.  Weight 422.  Respiratory rate is 18.  GENERAL:  He is in no acute distress.  NECK:  Carotids are full.  LUNGS:  Clear.  HEART:  Reveals a soft S1-S2 that is irregular.  ABDOMEN:  Protuberant, good bowel sounds.  Organomegaly could not be  assessed.  EXTREMITIES:  Reveal 1+ edema.  He has lot of chronic changes as well.  Pulses could not be appreciated.  NEUROLOGIC:  Exam is intact.   EKG shows atrial fibrillation with left axis deviation, low voltage.  There have been no acute changes.   ASSESSMENT AND PLAN:  [name] [name] is stable from our standpoint.  I am  delighted that he is at least taking his medications.  Unfortunately, he  continues to gain weight, and I am not sure he is ever going to  be able  to comply with a diet.  At this point in time, we will continue his  current medications and forego any cardioversion.  I think it would be  very difficult to cardiovert him considering his size, and we may have  to do this in the EP Lab.  In addition, I am not sure  he will stay in normal rhythm.  Also, he is not that symptomatic.  I  will plan on seeing him back in 3 months.     [name] C. Daleen Squibb, MD, [facility]  Electronically Signed    TCW/MedQ  DD: 12/04/2007  DT: 12/04/2007  Job #: [id number]
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
## 2010-11-19 NOTE — Consult Note (Signed)
[name], FACE NO.:  [id number]   MEDICAL RECORD NO.:  [id number]                   PATIENT TYPE:  INP   LOCATION:  4743                                 FACILITY:  [facility]   PHYSICIAN:  [name], M.D.               DATE OF BIRTH:  1961-03-20   DATE OF CONSULTATION:  02/29/2004  DATE OF DISCHARGE:                                   CONSULTATION   HISTORY OF PRESENT ILLNESS:  [name] is a 50 year old right-handed,  morbidly-obese white male born 05-08-1961, with a history of a dilated  cardiomyopathy associated with atrial fibrillation.  This patient has been  found to have a left atrial clot and has been treated with heparin and  Coumadin during this admission.  This patient has spontaneously converted to  normal sinus rhythm on his own.  The patient had been taken off of Coumadin  and heparin due to an upper GI bleed.  GI workup showed only a small hiatal  hernia.  The patient is back on Coumadin with an INR of 1.7.  The patient  had an episode today, however, where he specifically describes a monocular  event with a rather slow onset of visual disturbance occurring over six  minutes where he noted central visual complaints with red coloration that  gradually expanded out, leaving him with only a hole of normal vision in  the temporal periphery.  The patient was unable to see through the red  coloration.  The patient then had gradual resolution of the entire deficit,  and normalization of vision in the left eye.  The patient is positive that  only the left eye was involved.  The patient then developed a bit of a  headache in the vertex of the head that was not severe, and gradually  improved over time.  The patient is left feeling somewhat tired, fatigued,  worn out.  The patient denied any other associated symptoms such as numbness  or weakness on the face, arms, legs, speech disturbance, double vision.   The patient weighs 428  pounds, and a CT scan of the head therefore could not  be performed.   The patient was placed back on heparin at this point, and will be continued  to be converted to Coumadin. The patient feels like he is back to his normal  baseline at this point.  Neurology was asked to see the patient for further  evaluation.   PAST MEDICAL HISTORY:  Significant for:  1.  History of transient ocular ischemia O.S. as above.  2.  Morbid obesity.  3.  Atrial thrombus in the left atrium.  4.  Dilated cardiomyopathy with an ejection fraction of 25%.  5.  Atrial fibrillation.  6.  Varicose veins.  7.  Severe peripheral edema.  8.  Questionable upper GI bleed during this admission.  9.  Pilonidal cyst resection in  the past.  10. History of migraine.  The patient reports no recent migraine headaches.   ALLERGIES:  The patient has no known allergies.  He does not currently smoke  or drink.   MEDICATIONS:  1.  Diltiazem 480 mg daily.  2.  Lasix 80 mg IV b.i.d.  3.  Heparin therapy.  4.  Reglan 10 mg p.o. a.c. and h.s.  5.  Lopressor 50 mg b.i.d.  6.  Protonix 40 mg b.i.d.  7.  Potassium 20 mEq t.i.d.  8.  Coumadin therapy.  9.  Tylenol if needed.  10. Morphine if needed.   SOCIAL HISTORY:  This patient is not married. He lives in the _________,  [facility] area. He works in an Arts administrator.  He has two sons  who are alive and well.  The patient has a girlfriend, but again is not  married.   FAMILY MEDICAL HISTORY:  Notable that mother is alive with a history of  hypertension.  Father is alive with history of diabetes.  The patient has  one sister who is alive and well other than obesity.   REVIEW OF SYSTEMS:  Notable for no fevers or chills.  The patient denies any  swallowing problems, speech problems, or neck pain.  Denies recent chest  pain.  He has had some shortness of breath at times. Denies any nausea or  vomiting, trouble controlling the bowel or bladder, black-out episodes  or  seizures.   PHYSICAL EXAMINATION:  VITAL SIGNS:  Blood pressure 123/80, heart rate 78,  respiratory rate 20.  Temperature afebrile.  GENERAL:  This patient is a morbidly obese white male who is alert and  cooperative at the time of examination.  HEENT:  Head is atraumatic.  Eyes:  Pupils are equal, round and reactive to  light.  Disks are soft, flat bilaterally.  No carotid bruits noted.  RESPIRATORY EXAMINATION:  Clear.  CARDIOVASCULAR EXAMINATION:  Reveals distant heart sounds, no obvious  murmurs or rubs noted.  EXTREMITIES:  Revealed 3 to 4+ edema in the lower extremities below the  knees.  NEUROLOGIC EXAMINATION:  Cranial nerves as above.  Facial asymmetry is  present.  The patient has good sensation of the fact to pinprick and soft  touch bilaterally.  He has good strength of the facial muscles and muscles  of head, tongue and shoulder shrug bilaterally.  Speech is well enunciated.  He denies aphasia.  Motor testing reveals 5/5 strength in all fours.  Good  symmetric motor tone and strength bilaterally.  Sensory testing is intact to  pinprick, soft-touch and vibratory sensation throughout.  The patient has  good finger-nose-finger and heel-to-shin.  Gait was not tested. The patient  has depressed but symmetric reflexes throughout, toes neutral bilaterally.  Again, visual fields were full to double simultaneous stimulation.   LABORATORY DATA:  Notable for INR of 1.7 today, sodium 135, potassium 4.2,  chloride 100, CO2 29, glucose 113, BUN 22, creatinine 1.0, calcium 9.0.  White count 11.2, hemoglobin 14.0, hematocrit 40.9, MCV of 87.9, platelets  of 206,000.   IMPRESSION:  1.  Transient monocular event, rule out ophthalmic artery transient ischemic      attack versus ophthalmic migraine.  2.  Morbid obesity.  3.  Dilated cardiomyopathy.  4.  Atrial fibrillation.  5.  Left atrial clot.  ASSESSMENT/PLAN:  This patient is being converted to Coumadin at this point.  The  patient describes an episode involving the left eye only that is  somewhat unusual for a transient ischemic attack affecting the ophthalmic  artery.  Certainly if one is considering the possibility of an embolic  source deficit, the patient describes a very slow onset deficit rather than  a rapid onset deficit, one may expect with a embolus.  Colors were involved  with the event.  Towards the end of the ocular event, there were sparkles of  light and some migration of the deficits.  Ocular migraine certainly can  produce these events.  Would doubt any other process such as retinal  detachment as the visual  deficit fully cleared.  However, this patient is at risk for cardiac emboli  and is being converted to Coumadin, and is on heparin at this point.  I have  very little else to offer at this point.  The patient should be therapeutic  on his Coumadin the next day or two.  We will follow the patient's clinical  course while in house.                                               [name], M.D.    CKW/MEDQ  D:  02/29/2004  T:  03/01/2004  Job:  [id number]   cc:   Jesse Sans. Wall, M.D.   [facility]  [address]

## 2010-11-19 NOTE — Assessment & Plan Note (Signed)
[facility]                              CARDIOLOGY OFFICE NOTE   NAME:[name], [name]                        MRN:          [id number]  DATE:05/05/2006                            DOB:          04/06/1961    [name] returns today after having several no shows.  I basically told  him to show or we would have to drop him from our cardiology practice.  He  is seeing Dr. Blossom Hoops at [facility].   Unfortunately, he has not taken his health very seriously.  He continues to  smoke.  He has gained additional weight (though he has lost about 21 pounds  here over the last several months, after weighing in at a maximum of about  430 pounds), and he is not checking his blood sugars on a regular basis.  He  seems to be compliant with his Coumadin.  He is extremely tired.  He works 2-  3 hours and falls asleep.  He denies any symptoms of apnea and does not  think that is the problem.  He thinks it is most related to being up and  down all night urinating from having to take Lasix.  He is currently  unemployed.  He is looking for disability.   MEDICATIONS:  1. Coumadin as directed.  2. Xopenex.  3. Lopressor 50 b.i.d.  4. Lasix 120 mg in the morning and 80 in the evening.  5. Lisinopril 10 mg p.o. b.i.d.  6. Potassium gluconate 3 b.i.d.   PHYSICAL EXAMINATION:  VITAL SIGNS:  His blood pressure is 153/89.  His  pulse is 81.  He is in a sinus rhythm.  His weight is 415.  HEENT:  Normocephalic atraumatic.  PERRLA.  Extraocular movements intact.  His facial symmetry is normal.  NECK:  Carotids are full.  There are no bruits.  JVD was difficult to  assess.  Thyroid is not enlarged.  LUNGS:  Clear.  HEART:  Reveals a soft S1 S2.  ABDOMEN:  Protuberant.  Good bowel sounds.  Organomegaly can not be  assessed.  EXTREMITIES:  Reveal 1+ pitting edema.  Pulses were present.  NEUROLOGIC:  Intact.   EKG shows a sinus rhythm with PACs.  He has low voltage,  nonspecific ST  segment changes which are unchanged.   I had about a 20-minute discussion with [name] today.  I strongly encouraged  him to get back into employment, try to get some insurance, look into a  gastric reduction procedure.  I have encouraged him to continue to loose  weight and to take better care of himself in general.   I feel quite frankly that he will not meet disability criteria for his  heart.  Before undergoing even a formal assessment, he needs a 2D echo to  assess his LV function.  I did not order one today because of prohibitive  cost.   I have made no change in his program.  Hopefully, he will take our talk to  heart.  Depending on how he is doing, we will see him back again in a  year.     [name]. Daleen Squibb, MD, [facility]  Electronically Signed    TCW/MedQ  DD: 05/05/2006  DT: 05/05/2006  Job #: [id number]   cc:   Blossom Hoops, Dr.

## 2010-11-19 NOTE — Discharge Summary (Signed)
[name], [name]               ACCOUNT NO.:  [id number]   MEDICAL RECORD NO.:  [id number]          PATIENT TYPE:  INP   LOCATION:  4743                         FACILITY:  [facility]   PHYSICIAN:  Thomas C. Wall, M.D.   DATE OF BIRTH:  [birth year]   DATE OF ADMISSION:  02/18/2004  DATE OF DISCHARGE:  03/02/2004                           DISCHARGE SUMMARY - REFERRING   DISCHARGE DIAGNOSES:  1.  Transient ocular ischemia.  2.  Morbid obesity.  3.  Paroxysmal atrial fibrillation.  4.  Long-term Coumadin therapy.  5.  Gastroesophageal reflux disease.  6.  Esophagitis.  7.  Hiatal hernia.  8.  History of esophageal stricture.  9.  History of pancreatitis.  10. 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 Obesity.  12. Tobacco abuse.   [name] is a 50 year old male patient with a history of coronary artery  disease who began feeling tired and experienced diaphoresis and dyspnea on  exertion that progressed and worsened over the past eight months.  [place]  the airport [place], he became dizzy, with shortness of breath  and essentially passed out.  He did receive CPR.  EMS was summoned and the  patient was found to be in atrial fibrillation with RV on the monitor.  Compressions were stopped at that time.  The patient was transported to  [employer] [facility].   His 2-D echo revealed an EF of 50% with moderate LVH, mild left atrial  enlargement and D-dimer was elevated at 2.58; however, we were unable to  obtain a CT second to morbid obesity.  Because of his paroxysmal atrial  fibrillation, we would need to anticoagulate him and this was started.  A  TEE was performed and the EF did not appear to be normal.  There was a poor  window, however, secondary to obesity.  The EF appeared to be 25%.  There  was a left atrial appendage with an echo density consistent with thrombus of  the neck.  Prior to fully being anticoagulated.  The patient  did undergo a  cardiac catheterization that revealed no focal proximal stenosis nor very  large coronaries with the right coronary artery being dominant.  The patient  was noted to have a severe cardiomyopathy with an EF of 25%.  At this point,  medical therapy was intended with good rate control.   GI consult was obtained for epigastric fullness.  The patient underwent an  EGD.  This revealed a hiatal hernia, otherwise normal.   During the patient's hospitalization he did complain of sudden onset of left  monocular blindness preceded by generalized headache and aura in the left  eye.  This was completely resolved after 15 minutes.  After [name]  was consulted, it was felt that the patient had some transient ocular  ischemia, although an ocular migraine was the differential.  The symptoms  had fully resolved with no residual and no new recommendations were made.  The patient was still on heparin being anticoagulated with Coumadin and the  patient had INR of 3.0 at  discharge.  We did give him 150 mg of Lovenox  subcutaneous just prior to discharge  and then he was maintained on  Coumadin.   He would need to remain on Coumadin indefinitely and we have asked him to  follow up with the Coumadin clinic on March 05, 2004, at 8 a.m.  He is to  follow up with Dr. Daleen Squibb on March 23, 2004, at 2:15 p.m. and he is to  follow up with Dr. Leone Payor on March 30, 2004, at 8:45 a.m.   DISCHARGE MEDICATIONS:  1.  Lasix 40 mg b.i.d.  2.  KCl 20 mEq b.i.d.  3.  Coumadin 5 mg one and a half tablets daily.  4.  Darvocet-N 100 p.r.n. pain.  5.  Metoprolol 50 mg one tablet twice a day.  6.  Diltiazem XR 480 mg a day.  7.  Protonix 40 mg q.a.m.  8.  Reglan 10 mg a.c. and h.s.   He may increase his walking daily.  Elevate legs as much as possible.  Weigh  daily and record and bring these weights to show Dr. Daleen Squibb.       LB/MEDQ  D:  04/05/2004  T:  04/05/2004  Job:  [id number]   cc:    Dr.  [facility]  [facility]   Iva Boop, M.D. [facility]  [address]  [place]  [place] [address]  Fax: [phone]

## 2010-11-19 NOTE — Consult Note (Signed)
NAME:  [name], HINCHLIFFE NO.:  [id number]   MEDICAL RECORD NO.:  [id number]                   PATIENT TYPE:  INP   LOCATION:  4743                                 FACILITY:  [facility]   PHYSICIAN:  Iva Boop, M.D. [facility]           DATE OF BIRTH:  02-10-1961   DATE OF CONSULTATION:  DATE OF DISCHARGE:                                   CONSULTATION   CONSULTING PHYSICIAN:  Iva Boop, M.D. [facility].   REQUESTING PHYSICIAN:  Jesse Sans. Wall, M.D.   PRIMARY CARE PHYSICIAN:  Dr. Terance Hart.   REASON FOR CONSULTATION:  Abdominal fullness.   HISTORY:  This is a 50 year old white man admitted to the hospital after he  had a syncopal episode, thought secondary to rapid ventricular response with  atrial fibrillation.  He actually was coded and had CPR.  He seems to be  improving from that.  He has been treated with Cardizem.  His ejection  fraction is 25%.  He has been complaining of some epigastric fullness about  20 minutes after eating any size meal over the last eight months.  There is  no real heartburn, nausea, vomiting, diarrhea, constipation, or bleeding  reported.  He says his stomach feels hard when this happens, i.e., the  epigastric area gets hard and firm.  The fullness will last for about four  hours and it is then relieved with defecation or just waiting.  There is no  dysphagia described.   PAST MEDICAL HISTORY:  1. Morbid obesity.  2. Recent diagnosis of cardiomyopathy and atrial fibrillation.  3. History of intermittent globus type sensation.  4. Varicose veins.   MEDICATIONS:  1. Lasix 80 mg b.i.d.  2. Diltiazem 480 mg every day.  3. Xopenex.  4. Protonix 40 mg b.i.d.  5. Lopressor 50 mg b.i.d.  6. K-Dur 20 mEq  t.i.d.   DRUG ALLERGIES:  None known.   SOCIAL HISTORY:  He lives in [place].  He is employed in Agricultural engineer.  He smokes cigars.  No alcohol.  He lives with his girlfriend.   FAMILY HISTORY:  Mother alive at 38.  Father  23 with diabetes and  intestinal growth.  Paternal grandfather had stomach cancer.  Paternal  grandmother had some sort of intestinal cancer.   REVIEW OF SYSTEMS:  Notable for things mentioned above.  He has dyspnea and  orthopnea.  Otherwise all other systems are negative.   PHYSICAL EXAMINATION:  GENERAL:  Reveals a morbidly obese, middle-aged,  white man.  He is in no acute distress.  VITAL SIGNS:  The blood pressure is 132/91, temperature 98.1, pulse 87,  respirations 20.  HEENT:  The eyes are anicteric.  Mouth free of lesions.  NECK:  Supple without mass, thyromegaly.  CHEST:  Clear, though reduced breath sounds.  HEART:  Distant S1 S2.  No murmurs, rubs or gallops.  Chest wall is  nontender, except  in the lower area around the ribs.  ABDOMEN:  Morbidly obese, soft, nontender.  Exam is limited due to the  _________ .  EXTREMITIES:  With 2+ lower extremity edema.  SKIN:  No acute rash.  NEURO:  He is alert and oriented  x 3.  LYMPH NODES:  No neck or supraclavicular nodes.   LABORATORY DATA:  B-MET looks normal, except for CO2 23.  White count 11.2,  hemoglobin 14.  AST 42, ALT 72, alk phos 71, bilirubin 0.7, albumin 3.3,  calcium 9.1.  INR is 1.7.  TSH 7.7 along with a T4 of 8.2.   Chest x-ray shows cardiomegaly.   ASSESSMENT:  1. Epigastric fullness, dyspepsia, pain, etiology not clear.  Ulcer is     possible.  I am not sure he is on Protonix at home.  He could have     hepatomegaly as I suspect he may have some component of right heart     failure and/or steatohepatitis causing his abnormal liver function tests     or just steatosis which can raise liver function tests without     inflammation in the liver.  Gastroparesis is possible, though we do not     know he is a diabetic making that less likely.  2. Abnormal liver function tests, most likely on the basis of fatty liver as     mentioned above.  Need to rule out other causes.   PLAN:  1. EGD.  2. Investigate his  abdominal symptoms.  3. Serologic workup of his abnormal LFTs with hepatitis serologies.  4. Will go from there.  5. Empiric Reglan 10 mg q.a.c. and q.h.s.   I have explained the risks, benefits, and indications of upper GI endoscopy  and he understands and agrees to proceed.   I appreciate the opportunity to care for this patient.                                               Iva Boop, M.D. [facility]    CEG/MEDQ  D:  02/27/2004  T:  02/28/2004  Job:  [id number]   cc:   Thomas C. Wall, M.D.   Dorothey Baseman  [address]  [place]  [place] [address]  Fax: [phone]

## 2010-11-19 NOTE — Cardiovascular Report (Signed)
[name], [name] NO.:  [id number]   MEDICAL RECORD NO.:  [id number]                   PATIENT TYPE:  INP   LOCATION:  4743                                 FACILITY:  [facility]   PHYSICIAN:  Vida Roller, M.D.                DATE OF BIRTH:  12/05/1960   DATE OF PROCEDURE:  DATE OF DISCHARGE:                              CARDIAC CATHETERIZATION   DATE OF PROCEDURE:  February 26, 2004.   PRIMARY CARE PHYSICIAN:  Dorothey Baseman, MD, at the [facility].   PROCEDURES PERFORMED:  1. Left heart catheterization via the radial approach.  2. Selective coronary angiography via the radial approach.  3. Left ventriculography via the radial approach.   HISTORY OF PRESENT ILLNESS:  [name] is a 50 year old man with morbid  obesity in excess of 400 pounds, who had a cardiac arrest and was found to  be in atrial fibrillation with rapid ventricular response and was admitted.  Evaluation revealed on transesophageal echo an ejection fraction of 52%, and  he was referred for elective coronary angiography.  Due to his morbid  obesity, access was felt to be safest done through the right radial  approach, and this was successfully accomplished.   DETAILS OF PROCEDURE:  After obtaining informed consent, the patient was  brought to the cardiac catheterization laboratory in the fasting state, and  there he was prepped and draped in the usual sterile manner.  A local  anesthetic was obtained over the right wrist using 1% lidocaine without  epinephrine.  The right radial artery was cannulated using the modified  Seldinger technique with a 20 cm, 6-French sheath, and left heart  catheterization was performed using a 6-French Judkins left #4, a 6-French  Judkins right #4, and a 6-French pigtail catheter.  The pigtail catheter was  used for the left ventriculogram, which was imaged in the 25-degree RAO  view.  At the conclusion of the procedures, the catheters were  removed, the  sheath was then removed, hemostasis was obtained using a __________ device.  At the conclusion of the procedure, there was no evidence of hematoma or  ecchymosis formation, and the pulse oximetry in the right thumb and right  third finger was 97% on both.   RESULTS:  Aortic pressure was measured at 117/93 with a mean arterial  pressure of 104, left ventricular pressure 115/18 with an end diastolic  pressure of 25.   CORONARY ANGIOGRAPHY:  These were extremely difficult images.  Clear imaging  of the proximal to mid portions of all the coronary arteries was really the  only thing that was successfully accomplished due to the patient's morbid  obesity.  However, there appears to be a right dominant system and there  appears to be no significant proximal stenoses in any of the major coronary  arteries.   The left ventricular function appears to be severely impaired, with an  ejection fraction of 52%.   Of note is that the patient had a heart rate in excess of 140 beats a minute  throughout the entire procedure in atrial fibrillation.                                               Vida Roller, M.D.    JH/MEDQ  D:  02/26/2004  T:  02/26/2004  Job:  [id number]

## 2010-11-19 NOTE — Discharge Summary (Signed)
[name], [name]               ACCOUNT NO.:  [id number]   MEDICAL RECORD NO.:  [id number]          PATIENT TYPE:  INP   LOCATION:  3739                         FACILITY:  [facility]   PHYSICIAN:  Thomas C. Wall, M.D.   DATE OF BIRTH:  09/19/1960   DATE OF ADMISSION:  03/15/2005  DATE OF DISCHARGE:  03/21/2005                                 DISCHARGE SUMMARY   PRINCIPAL DIAGNOSIS:  Dyspnea.   OTHER DIAGNOSES:  1.  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  2.  History of atrial fibrillation.  3.  Morbid obesity.  4.  Tobacco abuse, smoking 3 cigars a day x 5 years.  5.  Gastroesophageal reflux disease with history of esophageal stricture.  6.  History of left atrial appendage thrombus in August 2005.  7.  History of transient ocular ischemia of the left eye.  8.  Migraine headaches.  9.  Peripheral edema.  10. Impaired glucose tolerance.   ALLERGIES:  No known drug allergies.   PROCEDURES:  Transthoracic echocardiogram.   HISTORY OF PRESENT ILLNESS:  50 year old white male with a history of  paroxysmal atrial fibrillation and dilated cardiomyopathy, who came off all  his medications approximately six months ago because he was feeling good.  Approximately 6-8 weeks ago, he began to experience decrease in exercise  tolerance with increasing lower extremity edema, orthopnea, and now can only  walk approximately 35 yards before experiencing dyspnea.  On September 11,  he experienced sudden onset of flushing and diaphoresis associated with  dizziness and tachy palpitations with resolution of symptoms in  approximately ten minutes.  On the day of admission, he was at work and had  recurrence of tachy palpitations, diaphoresis, and shortness of breath.  At  that point, he presented to [facility] where EKG showed  sinus rhythm with elevated blood pressure of 177/102.  Chest x-ray showed no  acute disease and the decision was made to admit him for fluid  overload and  diuresis.   HOSPITAL COURSE:  Mr. Hase was admitted and ruled out for MI.  He was  initiated on IV Lasix with brisk diuresis and improvement in respiratory  status.  He underwent transthoracic echocardiogram on September 13 revealing  an EF of 25-35% and severe, diffuse LV hypokinesis and mild LVH.  The left  atrium was mildly dilated.  His home medications, which he had come off six  months prior, were already initiated including  beta blocker, ACE inhibitor,  Coumadin, and diuretic therapy.  His beta blocker and ACE inhibitor were  titrated as heart rate and blood pressure would allow.  Symptomatically, he  continued to improve and he did not experience any paroxysmal atrial  fibrillation while hospitalized.  On September 17, he did have some dyspnea  which was successfully treated with Xopenex nebulizer.  He is being  discharged home today in satisfactory condition.   DISCHARGE LABORATORY DATA:  Hemoglobin 15.2, hematocrit 44.1, WBC 9.7,  platelets 232.  PT 30.4, INR 2.9.  Sodium 140, potassium 4.7, chloride 100,  CO2 32, BUN 20, creatinine 1, glucose 108, calcium  9.6, total bilirubin 0.7,  alkaline phos 76, AST 20, ALT 20, total protein 7.3, albumin 3.6, magnesium  1.8.  Cardiac markers were negative x 3.  Total cholesterol 186,  triglycerides 130, HDL 39, LDL 121.  TSH 2.927.   DISPOSITION:  The patient is being discharged home today in satisfactory  condition.   FOLLOW UP:  The patient is to follow up with Dr. Vern Claude nurse practitioner  on October 6 at 12 p.m.  He also has follow up in Coumadin Clinic at [facility] on September 21 at 11:15.   DISCHARGE MEDICATIONS:  Lasix 120 mg q.a.m., 80 mg q.p.m., Coumadin 7.5 mg  q.h.s., Lopressor 50 mg q.12h., Lisinopril 10 mg b.i.d., K-Dur 40 mEq  b.i.d., Albuterol MDI 1 puff q.i.d. and p.r.n., Protonix 40 mg daily.   Outstanding lab studies are none.  Duration of discharge encounter 45  minutes including  physician time.      Ok Anis, NP      Jesse Sans. Wall, M.D.  Electronically Signed    CRB/MEDQ  D:  03/21/2005  T:  03/21/2005  Job:  [id number]

## 2010-11-19 NOTE — Assessment & Plan Note (Signed)
[facility]                          [place] OFFICE NOTE   NAME:[name] [name]                        MRN:          [id number]  DATE:01/23/2006                            DOB:          1960/09/11    REASON FOR VISIT:  Cyst.   [name] [name] is a 50 year old male who presents today with a chronic history  of a pilonidal cyst.  He reports that it was first noted in 1995 and since  then he has had two surgical resections of the cyst with minimal  improvement.  Over the last year the cyst has been draining constantly with  pain.  He is aware that he will need either surgical or wound clinic  intervention but at this point does not have insurance.  He reports that the  drainage/cyst is not worsened in the last year but is very distracting and  uncomfortable.   PAST MEDICAL HISTORY:  1.  Hypertension.  2.  Atrial fibrillation with history of a deep vein thrombosis to the left      thigh.   SURGICAL HISTORY:  Pilonidal cyst resection x2.   MEDICATIONS:  1.  Lasix 80 mg p.m. and 120 mg q.a.m.  2.  Lopressor 50 mg b.i.d.  3.  Xopenex.  4.  Coumadin 5 mg daily.  The patient goes to the Coumadin clinic every 2      weeks.  5.  K-Dur 40 mEq twice a day.  6.  Lisinopril b.i.d.  The patient does not recall the dose.   ALLERGIES:  No known drug allergies.   FAMILY HISTORY:  Father and mother both alive.  A significant family history  for hypertension, diabetes and obesity.  He has one sister who is alive and  well.   SOCIAL HISTORY:  The patient is unemployed.  Smokes cigars regularly.  He is  separated with 2 children and denies alcohol use.   OBJECTIVE:  GENERAL:  We have a pleasant, morbidly obese male who weighs 414  pounds, in no acute distress.  SKIN:  Examination of the gluteal cleft significant for a draining sinus at  the superior aspect of the gluteal cleft.  There is mild to moderate  tenderness surrounding the sinus.  According  to father, who is present, this  is the chronic presentation of this sinus for the last 1 year.  There is no  surrounding erythema.  There is serosanguineous fluid noted on the dressing  that the patient had in place.   IMPRESSION:  A 50 year old morbidly obese male with a pilonidal cyst that  continues to drain x1 year.  Unfortunately, the patient is without insurance  and at this point economically cannot afford being referred to a surgeon or  the wound clinic, which was my primary intention.   PLAN:  1.  After much discussion, patient still was not agreeable to the above      recommendation.  2.  Agreed to being place on Keflex 500 mg q.i.d. for 7 days.  3.  I also provided a prescription for Darvocet-N 100 one p.o. q.6h. p.r.n.  side effects were reviewed.  4.  I advised the patient to continue changing the dressing and reviewed any      signs of worsening infection, i.e., worsening drainage, pain,      surrounding erythema and/or fever.  The patient agreed that he will call      me in 3 months after he gets medical coverage to be referred to the      wound clinic at [facility].  5.  I advised the patient to follow up with the Coumadin clinic on one week      given the possible change in INR due to Keflex.  The patient expressed      understanding.                                   Leanne Chang, MD   LA/MedQ  DD:  01/23/2006  DT:  01/23/2006  Job #:  [id number]

## 2010-12-21 ENCOUNTER — Emergency Department (HOSPITAL_BASED_OUTPATIENT_CLINIC_OR_DEPARTMENT_OTHER)
Admission: EM | Admit: 2010-12-21 | Discharge: 2010-12-21 | Disposition: A | Discharge status: Home / Self Care | Payer: Medicare Other | Attending: Emergency Medicine | Admitting: Emergency Medicine

## 2010-12-21 DIAGNOSIS — I4891 Unspecified atrial fibrillation: Secondary | ICD-10-CM | POA: Insufficient documentation

## 2010-12-21 DIAGNOSIS — G8929 Other chronic pain: Secondary | ICD-10-CM | POA: Insufficient documentation

## 2010-12-21 DIAGNOSIS — I2510 Atherosclerotic heart disease of native coronary artery without angina pectoris: Secondary | ICD-10-CM | POA: Insufficient documentation

## 2010-12-21 DIAGNOSIS — I10 Essential (primary) hypertension: Secondary | ICD-10-CM | POA: Insufficient documentation

## 2010-12-21 DIAGNOSIS — M549 Dorsalgia, unspecified: Secondary | ICD-10-CM | POA: Insufficient documentation

## 2011-03-28 LAB — HEPARIN LEVEL (UNFRACTIONATED): Heparin Unfractionated: 0.11 — ABNORMAL LOW

## 2011-03-28 LAB — COMPREHENSIVE METABOLIC PANEL
ALT: 28
AST: 20
Albumin: 3.2 — ABNORMAL LOW
Alkaline Phosphatase: 78
BUN: 15
Chloride: 97
Potassium: 4.1
Sodium: 138
Total Bilirubin: 0.8

## 2011-03-28 LAB — BASIC METABOLIC PANEL
BUN: 14
CO2: 32
Calcium: 8.9
Chloride: 96
Creatinine, Ser: 1.01

## 2011-03-28 LAB — LIPID PANEL
HDL: 40
Total CHOL/HDL Ratio: 5.1
Triglycerides: 207 — ABNORMAL HIGH
VLDL: 41 — ABNORMAL HIGH

## 2011-03-28 LAB — CARDIAC PANEL(CRET KIN+CKTOT+MB+TROPI)
CK, MB: 2.5
Total CK: 105
Troponin I: 0.01
Troponin I: 0.01

## 2011-03-28 LAB — CBC
HCT: 41.1
HCT: 41.6
Hemoglobin: 13.9
MCHC: 33.5
MCV: 88.9
RBC: 4.62
RBC: 4.68
RDW: 13.8
WBC: 12.4 — ABNORMAL HIGH

## 2011-03-28 LAB — B-NATRIURETIC PEPTIDE (CONVERTED LAB): Pro B Natriuretic peptide (BNP): 479 — ABNORMAL HIGH

## 2011-03-28 LAB — MAGNESIUM: Magnesium: 1.8

## 2011-03-28 LAB — PROTIME-INR: Prothrombin Time: 13.1

## 2011-03-28 LAB — CK TOTAL AND CKMB (NOT AT ARMC): Relative Index: 2.5

## 2011-03-28 LAB — TSH: TSH: 1.762

## 2011-03-28 LAB — TROPONIN I: Troponin I: 0.01

## 2011-03-29 LAB — CBC
HCT: 40.9
HCT: 41.4
Hemoglobin: 14
Hemoglobin: 14.1
MCHC: 33.6
MCHC: 33.8
MCV: 89
MCV: 89.1
MCV: 89.7
Platelets: 213
Platelets: 214
RBC: 4.56
RBC: 4.65
RBC: 4.72
RDW: 13.4
WBC: 11.8 — ABNORMAL HIGH
WBC: 11.9 — ABNORMAL HIGH

## 2011-03-29 LAB — BASIC METABOLIC PANEL
BUN: 10
BUN: 9
CO2: 31
CO2: 34 — ABNORMAL HIGH
CO2: 35 — ABNORMAL HIGH
Calcium: 8.8
Chloride: 91 — ABNORMAL LOW
Chloride: 94 — ABNORMAL LOW
Chloride: 96
Creatinine, Ser: 1.02
GFR calc Af Amer: >60
GFR calc Af Amer: >60
GFR calc non Af Amer: >60
Glucose, Bld: 128 — ABNORMAL HIGH
Potassium: 3.5
Potassium: 3.9
Sodium: 137
Sodium: 138

## 2011-03-29 LAB — PROTIME-INR: Prothrombin Time: 28.5 — ABNORMAL HIGH

## 2011-03-29 LAB — B-NATRIURETIC PEPTIDE (CONVERTED LAB): Pro B Natriuretic peptide (BNP): 126 — ABNORMAL HIGH

## 2011-06-24 ENCOUNTER — Ambulatory Visit (INDEPENDENT_AMBULATORY_CARE_PROVIDER_SITE_OTHER): Payer: Medicare Other

## 2011-06-24 DIAGNOSIS — M545 Low back pain: Secondary | ICD-10-CM

## 2011-07-20 ENCOUNTER — Telehealth: Payer: Self-pay | Admitting: *Deleted

## 2011-07-20 NOTE — Telephone Encounter (Signed)
Called patient regarding delinquent visit to coumadin clinic, message left on phone and will mail letter.

## 2011-07-28 IMAGING — CR DG CHEST 1V PORT
1 series · 1 of 1 positions shown · non-contrast
Comparison: 02/10/2009

CLINICAL DATA: Mid back pain.  Short of breath.  Atrial
fibrillation.

PORTABLE CHEST - 1 VIEW

[view not recorded]
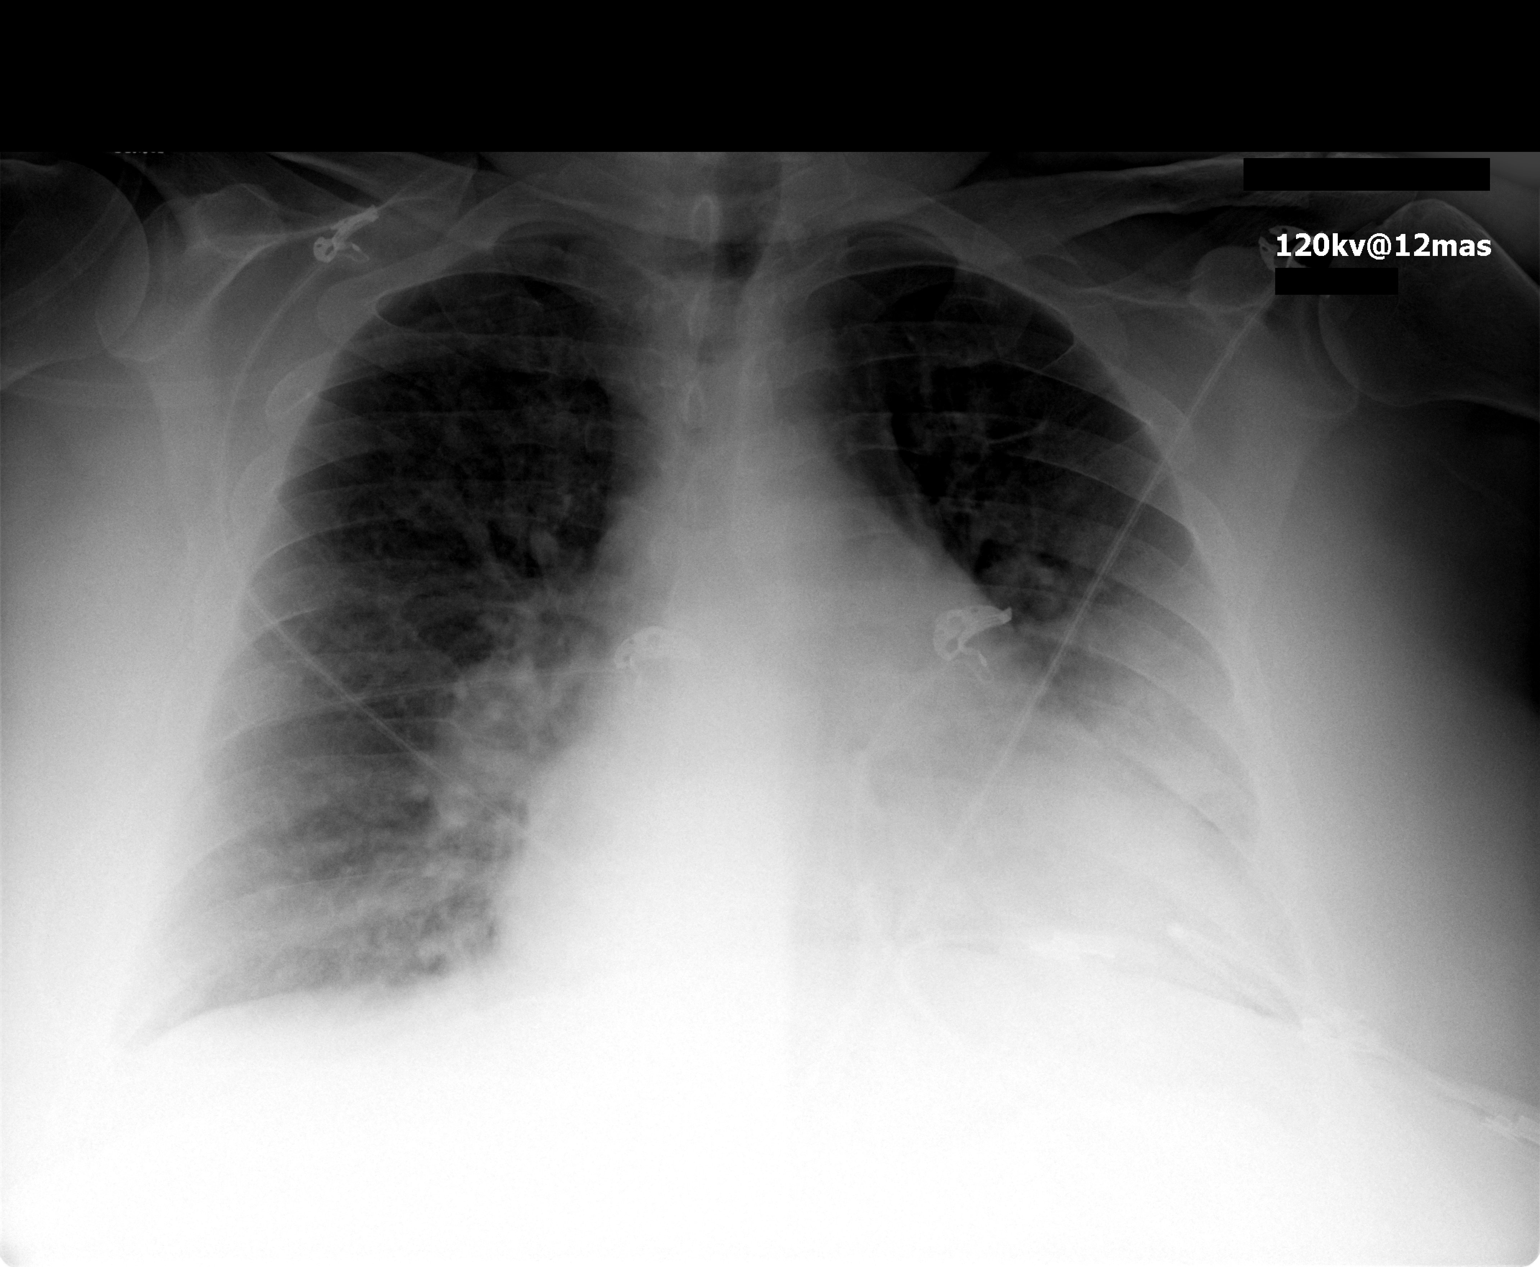

[1 of 1 positions shown; findings below may reference images not displayed]

FINDINGS: Moderately degraded exam due to AP portable technique and
patient body habitus.

Midline trachea.  Cardiomegaly accentuated by AP portable
technique.  No right-sided and no definite left-sided pleural
effusion. No pneumothorax.  Pulmonary interstitial prominence is
felt to be related to AP portable technique.  Mild pulmonary venous
congestion cannot be excluded.  Opacity at the inferior left
hemithorax could be due to overlying soft tissues.  Left lower lobe
airspace disease cannot be excluded.
IMPRESSION: 1. Decreased sensitivity and specificity exam due to technique
related factors, as described above.
2.  Cardiomegaly and low lung volumes.  Cannot exclude pulmonary
venous congestion.
3.  Suboptimal evaluation of the inferior left hemithorax.
Increased density which could relate to overlying soft tissues or
left lower lobe airspace disease/effusion. If PA and lateral
radiographs or possible, they should be considered.

## 2011-11-01 ENCOUNTER — Encounter: Payer: Self-pay | Admitting: *Deleted

## 2011-11-01 ENCOUNTER — Emergency Department (INDEPENDENT_AMBULATORY_CARE_PROVIDER_SITE_OTHER)
Admission: EM | Admit: 2011-11-01 | Discharge: 2011-11-01 | Disposition: A | Discharge status: Home / Self Care | Payer: Medicare Other | Source: Home / Self Care | Attending: Emergency Medicine | Admitting: Emergency Medicine

## 2011-11-01 DIAGNOSIS — M545 Low back pain, unspecified: Secondary | ICD-10-CM

## 2011-11-01 HISTORY — DX: Essential (primary) hypertension: I10

## 2011-11-01 MED ORDER — TRAMADOL HCL 50 MG PO TABS: 50.0000 mg | ORAL_TABLET | Freq: Four times a day (QID) | ORAL | Status: AC | PRN

## 2011-11-01 NOTE — ED Provider Notes (Signed)
History     CSN: [id number]  Arrival date & time 11/01/11  1358   First MD Initiated Contact with Patient 11/01/11 1433      Chief Complaint  Patient presents with  . Back Pain    (Consider location/radiation/quality/duration/timing/severity/associated sxs/prior treatment) HPI The patient presents today with back pain. Location: bilateral lower back Timing: constant x2-3 days Description: tighness, sore, was lifting a tire and felt his back pull.  He has done this in the past and has taken Tramadol which helped.  Took Flexeril which made him feel loopy. Worse with: movement Better with: rest, massage, NSAIDs Trauma: no Bladder/bowel incontinence: no Weakness: no Fever/chills: no Night pain: no Unexplained weight loss: no Cancer/immunosuppression: no PMH of osteoporosis or chronic steroid use:  no   Past Medical History  Diagnosis Date  . Hypertension     History reviewed. No pertinent past surgical history.  Family History  Problem Relation Age of Onset  . Diabetes Father     History  Substance Use Topics  . Smoking status: Passive Smoker    Types: Cigars  . Smokeless tobacco: Not on file  . Alcohol Use: No      Review of Systems  All other systems reviewed and are negative.    Allergies  Review of patient's allergies indicates no known allergies.  Home Medications   Current Outpatient Rx  Name Route Sig Dispense Refill  . LISINOPRIL 10 MG PO TABS Oral Take 10 mg by mouth daily.    . TRAMADOL HCL 50 MG PO TABS Oral Take 1 tablet (50 mg total) by mouth every 6 (six) hours as needed for pain. 44 tablet 0  . WARFARIN SODIUM 5 MG PO TABS Oral Take by mouth as directed.        BP 169/118  Pulse 87  Temp(Src) 98.1 F (36.7 C) (Oral)  Resp 20  Ht 5\' 8"  (1.727 m)  Wt 412 lb 8 oz (187.109 kg)  BMI 62.72 kg/m2  SpO2 98%  Physical Exam  Nursing note and vitals reviewed. Constitutional: He is oriented to person, place, and time. He appears  well-developed and well-nourished. He appears distressed (Pain transferring from sitting to standing).       Morbidly obese  HENT:  Head: Normocephalic and atraumatic.  Eyes: No scleral icterus.  Neck: Neck supple.  Cardiovascular: Regular rhythm and normal heart sounds.   Pulmonary/Chest: Effort normal and breath sounds normal. No respiratory distress.  Musculoskeletal:       Lumbar back: He exhibits tenderness, pain and spasm. He exhibits normal range of motion, no bony tenderness, no swelling and no edema.       Lower paralumbar spasm and tenderness.  Bilateral SI joint tenderness as well.  Distal neurovascular status is intact.  No central tenderness.  Neurological: He is alert and oriented to person, place, and time. He has normal strength and normal reflexes. No sensory deficit.  Skin: Skin is warm and dry. No rash noted.  Psychiatric: He has a normal mood and affect. His speech is normal.    ED Course  Procedures (including critical care time)  Labs Reviewed - No data to display No results found.   1. Pain in lower back       MDM   This patient has lower back pain.  This is likely secondary to a lumbar strain.  I did not see any red flags concerning with bony pathology and therefore did not believe x-rays are appropriate today.  We gave  him a prescription for tramadol.  He does not want them as relaxant.  Heating pad and rest and gentle stretching encouraged.  Avoid heavy lifting, pushing, pulling.  Patient also needs to follow up with his PCP concerning his blood pressure and his weight.        Marlaine Hind, MD 11/01/11 1444

## 2011-11-01 NOTE — ED Notes (Signed)
Pt c/o low back pain x 2 days, after changing a flat tire. He has taken advil with no relief. He states that he has taken tramadol in the past and that has helped.

## 2011-12-25 ENCOUNTER — Ambulatory Visit (INDEPENDENT_AMBULATORY_CARE_PROVIDER_SITE_OTHER): Payer: Medicare Other | Admitting: Family Medicine

## 2011-12-25 VITALS — BP 154/89 | HR 87 | Temp 98.4°F | Resp 20 | Ht 67.5 in | Wt >= 6400 oz

## 2011-12-25 DIAGNOSIS — M549 Dorsalgia, unspecified: Secondary | ICD-10-CM

## 2011-12-25 MED ORDER — TRAMADOL HCL 50 MG PO TABS: 50.0000 mg | ORAL_TABLET | Freq: Four times a day (QID) | ORAL | Status: DC | PRN

## 2011-12-25 NOTE — Patient Instructions (Signed)

## 2011-12-25 NOTE — Progress Notes (Signed)
New Patient Visit:  HPI:  Pt is here for follow up of back pain. This been an issue for several years.  Morbidly obese.  Has been taking tramadol # 240 per month for pain.  Pain mainly in lumbar back with radiation.  No bowel or bladder incontinence.   Lumbar MRI 2011:  IMPRESSION:  L4-5: Disc bulge. Mild foraminal narrowing without definite  neural compression.  L5-S1: Shallow protrusion but without apparent canal or foraminal  stenosis.  The findings could relate to low back pain, but I do not  demonstrate neural compressive pathology.   Also has baseline hx/o CHF and hx/o afib s/p cardioversion.  Was taking coumadin but stopped taking because of constant INR testing.  Could not afford pradaxa.  Refuses to restart coumadin.    Patient Active Problem List  Diagnosis  . DYSMETABOLIC SYNDROME X  . MORBID OBESITY  . Unspecified Essential Hypertension  . VENTRICULAR TACHYCARDIA  . ATRIAL FIBRILLATION  . CONGESTIVE HEART FAILURE  . SINUSITIS- ACUTE-NOS  . LOW BACK PAIN, CHRONIC  . BACK PAIN  . HYPERSOMNIA, ASSOCIATED WITH SLEEP APNEA  . HYPERGLYCEMIA  . SKIN CANCER, HX OF  . ATRIAL FIBRILLATION, HX OF   Past Medical History: Past Medical History  Diagnosis Date  . Hypertension     Past Surgical History: No past surgical history on file.  Social History: History   Social History  . Marital Status: Legally Separated    Spouse Name: N/A    Number of Children: N/A  . Years of Education: N/A   Social History Main Topics  . Smoking status: Passive Smoker    Types: Cigars  . Smokeless tobacco: Not on file  . Alcohol Use: No  . Drug Use: No  . Sexually Active:    Other Topics Concern  . Not on file   Social History Narrative  . No narrative on file    Family History: Family History  Problem Relation Age of Onset  . Diabetes Father     Allergies: No Known Allergies  Current Outpatient Prescriptions  Medication Sig Dispense Refill  . traMADol  (ULTRAM) 50 MG tablet Take 50 mg by mouth every 6 (six) hours as needed.      Marland Kitchen lisinopril (PRINIVIL,ZESTRIL) 10 MG tablet Take 10 mg by mouth daily.      Marland Kitchen warfarin (COUMADIN) 5 MG tablet Take by mouth as directed.         Review Of Systems: Negative except as noted above in HPI  Physical Exam: Filed Vitals:   12/25/11 1457  BP: 154/89  Pulse: 87  Temp: 98.4 F (36.9 C)  Resp: 20   General: alert, cooperative and moderately obese HEENT: PERRLA and extra ocular movement intact, large neck girth  Heart: S1, S2 normal, no murmur, rub or gallop, regular rate and rhythm Lungs: clear to auscultation, no wheezes or rales and unlabored breathing Abdomen:  Extremities: + venous stasis ulcers bilaterally  Skin:no rashes Neurology: normal without focal findings   Assessment and Plan:  Tramadol refilled  Discussed using in conjunction with tylenol.  Weight loss and exercise discussed.   Also discussed clotting risk in setting of hx/o afib and CHF. Broached issue of CHADs2-Vasc  Score (3).  Pt declined to discuss this.  Also broached ?xaralto. Referred back to cardiologist Corinda Gubler)

## 2011-12-26 ENCOUNTER — Ambulatory Visit: Payer: Self-pay | Admitting: Pharmacist

## 2011-12-26 DIAGNOSIS — I4891 Unspecified atrial fibrillation: Secondary | ICD-10-CM

## 2011-12-29 ENCOUNTER — Telehealth: Payer: Self-pay

## 2011-12-29 NOTE — Telephone Encounter (Signed)
PATIENT  WAS GIVEN PRESCRIPTION TO TAKE FOR 1 PILL EVERY SIX HOURS WITH ASPRIN.  HE ALSO UPPED IT TO TWO EVERY SIX HOURS WITH ASPRIN AND IT DID NOT HELP.  HE WAS TOLD THAT IF IT DIDN'T WORK TO CALL BACK AND THEY WOULD CHANGE THE PRESCRIPTION.  USES [facility] [address]

## 2011-12-30 NOTE — Telephone Encounter (Signed)
Tramadol was supposed to be taken with tylenol and not aspirin.  This was discussed with pt at length.  Pt is to try medication with tylenol.  If this does not work, then increasing medication may be considered.  Would like to avoid pushing max dose of medication if possible to avoid risk of serotonin syndrome and risk of seizures.  Thank you.

## 2011-12-31 NOTE — Telephone Encounter (Signed)
Spoke with patient-- he states he has not been taking rx with aspirin!  He says that he tried taking it with Tylenol 1000mg  every 6 hours, and it is not providing any relief.  However, when patient takes 2 Tramadol q 6 hours it gives him enough relief (he has always taken 2 pills q 6 hours in past).  Since he is doing this, then he will run out in 3 days-- would like refill sent in before this time for Tramadol 2 po qd dose.  Can we rx?

## 2011-12-31 NOTE — Telephone Encounter (Signed)
LMOM to CB to discuss. 

## 2012-01-01 MED ORDER — TRAMADOL HCL 50 MG PO TABS: 100.0000 mg | ORAL_TABLET | Freq: Four times a day (QID) | ORAL | Status: AC | PRN

## 2012-01-01 NOTE — Telephone Encounter (Signed)
Please call patient the patient can take 2 tablets every 6 hours as the maximum dose the we will allow one month prescription for this. We suggest he let us refer him to pain management. We will not be able to write further prescriptions after this one month increase in medication

## 2012-01-01 NOTE — Telephone Encounter (Signed)
Spoke with patient and advised below--patient states he has been getting this med from Dr. Georgiana Shore.  He does not want to go to pain clinic, so he will seek MD at another practice.  Can we discontinue previous script and rx 2q 6 hour dose?  How many refills?  Ok to close after we rx.

## 2012-01-01 NOTE — Addendum Note (Signed)
Addended by: Jacqualyn Posey on: 01/01/2012 03:18 PM   Modules accepted: Orders

## 2012-01-01 NOTE — Telephone Encounter (Signed)
[facility] notifying patient that updated rx was sent into pharmacy and prior rx called at pharmacy.  One month rx only, and patient must establish elsewhere to handle this medication-- no further refills from [place].

## 2012-01-01 NOTE — Telephone Encounter (Signed)
LMOM to CB. 

## 2012-01-01 NOTE — Telephone Encounter (Signed)
[name] please call patient we can refill his tramadol to take 2 every 6 hours we need to find out where this patient normally receives his pain medications. We cannot continue to refill his pain medications. We would be happy to refer him to pain management to manage these medications. We will help him in the interim with medications until that is established

## 2012-02-03 IMAGING — CR DG ANKLE COMPLETE 3+V*L*
3 series · 3 of 3 positions shown · non-contrast
Comparison: CT of the left lower leg performed 08/15/2008, and left
ankle radiographs performed 08/09/2008

CLINICAL DATA: Left medial ankle pain, radiating to the tibia and
fibula.

LEFT ANKLE COMPLETE - 3+ VIEW

[t ankle joint ap left (1 of 2)]
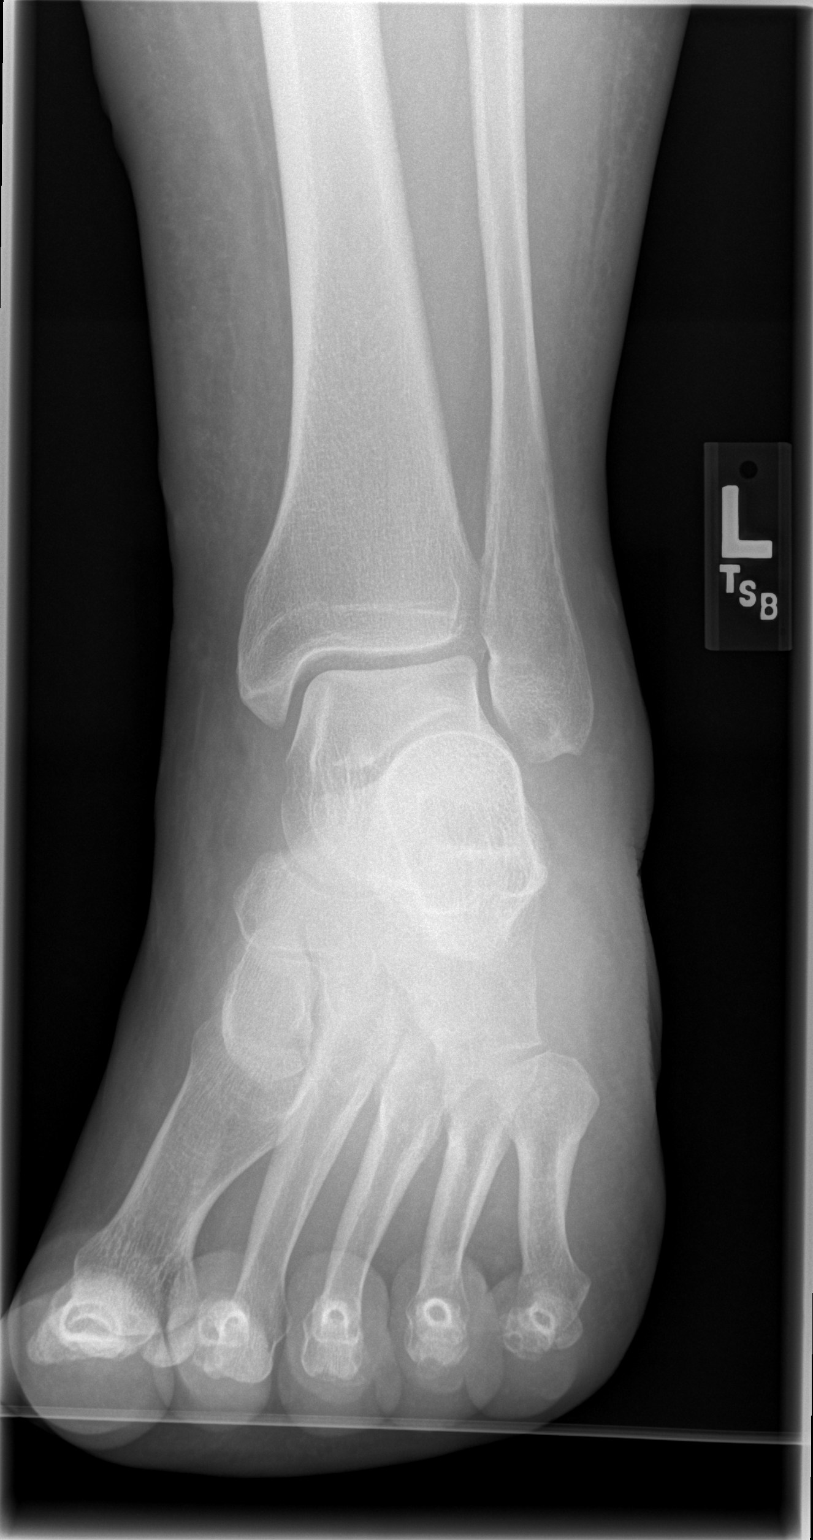

[t ankle joint ap left (2 of 2)]
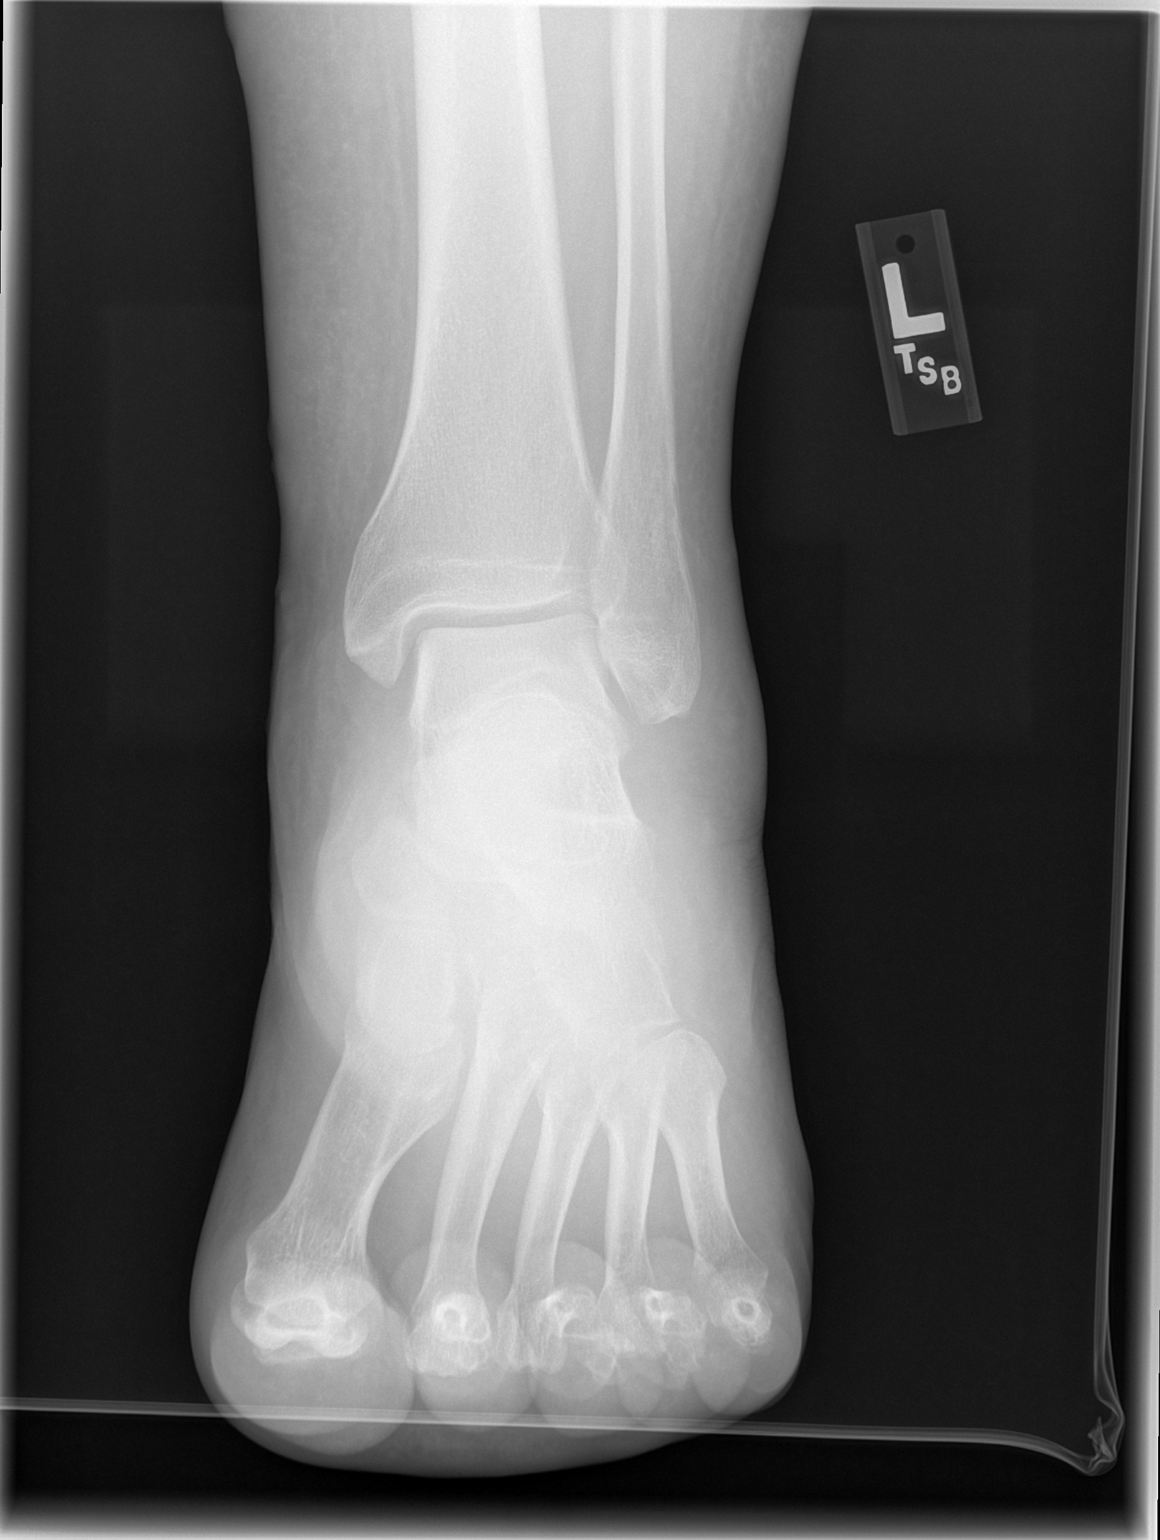

[t ankle joint oblique left]
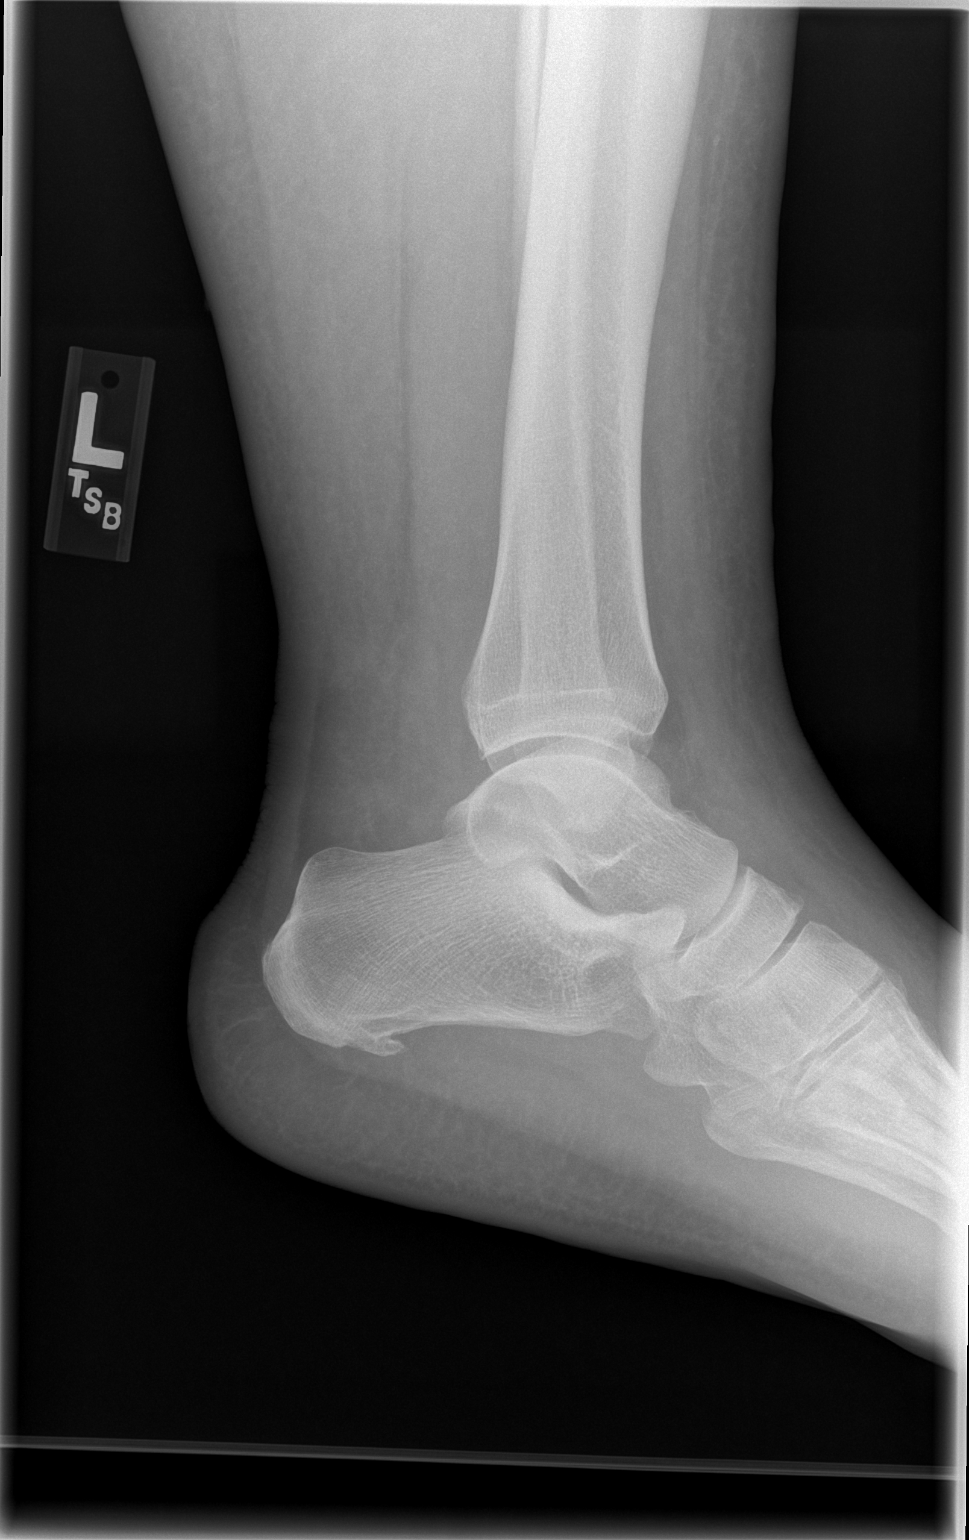

[3 of 3 positions shown; findings below may reference images not displayed]

FINDINGS: There is no evidence of fracture or dislocation.  The
ankle mortise is intact; the interosseous space is within normal
limits.  No talar tilt or subluxation is seen. A small plantar
calcaneal spur is incidentally noted.

The joint spaces are preserved.  Mild diffuse soft tissue swelling
is noted, more prominent medially.
IMPRESSION: No evidence of fracture or dislocation.

## 2012-11-30 ENCOUNTER — Ambulatory Visit (INDEPENDENT_AMBULATORY_CARE_PROVIDER_SITE_OTHER): Payer: Medicare Other | Admitting: Podiatry

## 2012-11-30 ENCOUNTER — Encounter: Payer: Self-pay | Admitting: Podiatry

## 2012-11-30 VITALS — BP 138/87 | HR 78

## 2012-11-30 DIAGNOSIS — M25572 Pain in left ankle and joints of left foot: Secondary | ICD-10-CM

## 2012-11-30 DIAGNOSIS — M109 Gout, unspecified: Secondary | ICD-10-CM | POA: Insufficient documentation

## 2012-11-30 DIAGNOSIS — M25579 Pain in unspecified ankle and joints of unspecified foot: Secondary | ICD-10-CM | POA: Insufficient documentation

## 2012-11-30 MED ORDER — TRAMADOL HCL 50 MG PO TABS: ORAL_TABLET | ORAL | Status: DC

## 2012-11-30 NOTE — Progress Notes (Signed)
Subjective:  Stated that he started to have pain on left ankle and wants to be medicated with Tramadol before it gets extreme pain. He usually have to take for about 10-14 days 50mg  two tablets every 6 hours. Stated that he is walking and swimming exercise and has lost 85 lbs. He used to weight 427 and now 349 lbs, and he feels very good with much energy.   Objective: Both lower limbs have severe case varicose vein with much discolorated skin on left lower lig. Edema is minimum.   Assessment: Chronic recurring Gout left ankle.  Plan: Rx. Tramadol 50mg  #120 to take two every 6 hours prn pain.

## 2013-02-22 ENCOUNTER — Ambulatory Visit: Payer: Medicare Other | Admitting: Podiatry

## 2013-03-19 ENCOUNTER — Ambulatory Visit (INDEPENDENT_AMBULATORY_CARE_PROVIDER_SITE_OTHER): Payer: Medicare Other | Admitting: Podiatry

## 2013-03-19 ENCOUNTER — Encounter: Payer: Self-pay | Admitting: Podiatry

## 2013-03-19 VITALS — Ht 67.0 in | Wt 333.0 lb

## 2013-03-19 DIAGNOSIS — M79609 Pain in unspecified limb: Secondary | ICD-10-CM

## 2013-03-19 DIAGNOSIS — M722 Plantar fascial fibromatosis: Secondary | ICD-10-CM

## 2013-03-19 DIAGNOSIS — M25579 Pain in unspecified ankle and joints of unspecified foot: Secondary | ICD-10-CM

## 2013-03-19 DIAGNOSIS — M79671 Pain in right foot: Secondary | ICD-10-CM

## 2013-03-19 MED ORDER — TRAMADOL HCL 50 MG PO TABS: ORAL_TABLET | ORAL | Status: DC

## 2013-03-19 NOTE — Patient Instructions (Addendum)
Seen for right heel pain. Diagnosis: Plantar fasciitis right heel. Injection given. May alleviate pain for uncertain duration.  Will benefit from Orthotic shoe inserts.  May continue with Tramadol.

## 2013-03-19 NOTE — Progress Notes (Signed)
Lost 100lbs. Walking and exercising a lot. For the last 2-3 months right heel has been hurting. Hurting every day and all day. Looked it up and said was plantar fasciitis. Tramadol takes care of the left heel pain (was diagnosed for pseudo gout) but did not help with the right heel pain.  Objective: All pedal pulses are palpable. Continue losing weight with exercise and diet.  No open skin lesions. Pain under the right heel.  X-rays taken: No acute changes seen on left heel.  Both heels have plantar calcaneal spur.  Positive of first ray elevatus.   Assessment: Plantar fasciitis right heel. Existing pseudo gout left heel.  Plan: Injection to right heel. A steroid injection was given at right plantar heel using 1% plain Lidocaine and 4 mg of Dexamethasone. This was well tolerated. Refill on Tramadol.

## 2013-03-20 DIAGNOSIS — M722 Plantar fascial fibromatosis: Secondary | ICD-10-CM | POA: Insufficient documentation

## 2013-03-20 DIAGNOSIS — M79671 Pain in right foot: Secondary | ICD-10-CM | POA: Insufficient documentation

## 2013-04-15 ENCOUNTER — Ambulatory Visit: Payer: Medicare Other | Admitting: Podiatry

## 2013-04-15 ENCOUNTER — Other Ambulatory Visit: Payer: Self-pay | Admitting: Podiatry

## 2013-04-15 MED ORDER — OXYCODONE-ACETAMINOPHEN 7.5-325 MG PO TABS: 1.0000 | ORAL_TABLET | Freq: Three times a day (TID) | ORAL | Status: DC | PRN

## 2013-04-24 NOTE — Progress Notes (Signed)
Good wound healing in progress. Discussed home care with instruction to continue with Silverdene cream dressing and lift up left heel while in bed using sponge block.

## 2013-05-03 ENCOUNTER — Other Ambulatory Visit: Payer: Self-pay | Admitting: Podiatry

## 2013-05-03 ENCOUNTER — Telehealth: Payer: Self-pay | Admitting: *Deleted

## 2013-05-03 MED ORDER — TRAMADOL HCL 50 MG PO TABS: ORAL_TABLET | ORAL | Status: DC

## 2013-05-03 MED ORDER — OXYCODONE-ACETAMINOPHEN 7.5-325 MG PO TABS: 1.0000 | ORAL_TABLET | Freq: Two times a day (BID) | ORAL | Status: DC | PRN

## 2013-05-03 NOTE — Telephone Encounter (Signed)
Patient called today 05/03/2013 requesting Pain Med.

## 2013-06-06 ENCOUNTER — Telehealth: Payer: Self-pay | Admitting: *Deleted

## 2013-06-06 NOTE — Telephone Encounter (Signed)
Patient called 06/06/2013 and request Refill on pain medicine.

## 2013-06-07 ENCOUNTER — Telehealth: Payer: Self-pay | Admitting: *Deleted

## 2013-06-07 MED ORDER — TRAMADOL HCL 50 MG PO TABS: ORAL_TABLET | ORAL | Status: DC

## 2013-06-07 MED ORDER — OXYCODONE-ACETAMINOPHEN 7.5-325 MG PO TABS: 1.0000 | ORAL_TABLET | Freq: Two times a day (BID) | ORAL | Status: DC | PRN

## 2013-06-07 NOTE — Telephone Encounter (Signed)
06/07/2013 Call from [facility] @ [facility] regarding [name], Patient has gotten over 1266 Tramodol the last 30 days from multiple Doctors. Pharmacist says he gave back the Rx to [name]. Pharmacist also faxed information over regarding these prescriptions. Phone number for [facility] is [phone].

## 2013-07-27 ENCOUNTER — Emergency Department (HOSPITAL_BASED_OUTPATIENT_CLINIC_OR_DEPARTMENT_OTHER): Payer: Medicare Other

## 2013-07-27 ENCOUNTER — Inpatient Hospital Stay (HOSPITAL_BASED_OUTPATIENT_CLINIC_OR_DEPARTMENT_OTHER)
Admission: EM | Admit: 2013-07-27 | Discharge: 2013-07-30 | DRG: 308 | Disposition: A | Discharge status: Home / Self Care | Payer: Medicare Other | Attending: Internal Medicine | Admitting: Internal Medicine

## 2013-07-27 ENCOUNTER — Encounter (HOSPITAL_BASED_OUTPATIENT_CLINIC_OR_DEPARTMENT_OTHER): Payer: Self-pay | Admitting: Emergency Medicine

## 2013-07-27 DIAGNOSIS — I420 Dilated cardiomyopathy: Secondary | ICD-10-CM

## 2013-07-27 DIAGNOSIS — I5031 Acute diastolic (congestive) heart failure: Secondary | ICD-10-CM

## 2013-07-27 DIAGNOSIS — Z9119 Patient's noncompliance with other medical treatment and regimen: Secondary | ICD-10-CM

## 2013-07-27 DIAGNOSIS — Z23 Encounter for immunization: Secondary | ICD-10-CM

## 2013-07-27 DIAGNOSIS — I428 Other cardiomyopathies: Secondary | ICD-10-CM | POA: Diagnosis present

## 2013-07-27 DIAGNOSIS — R7309 Other abnormal glucose: Secondary | ICD-10-CM | POA: Diagnosis present

## 2013-07-27 DIAGNOSIS — I509 Heart failure, unspecified: Secondary | ICD-10-CM | POA: Diagnosis present

## 2013-07-27 DIAGNOSIS — I5033 Acute on chronic diastolic (congestive) heart failure: Secondary | ICD-10-CM | POA: Diagnosis present

## 2013-07-27 DIAGNOSIS — Z8249 Family history of ischemic heart disease and other diseases of the circulatory system: Secondary | ICD-10-CM

## 2013-07-27 DIAGNOSIS — G4730 Sleep apnea, unspecified: Secondary | ICD-10-CM

## 2013-07-27 DIAGNOSIS — Z91199 Patient's noncompliance with other medical treatment and regimen due to unspecified reason: Secondary | ICD-10-CM

## 2013-07-27 DIAGNOSIS — Z833 Family history of diabetes mellitus: Secondary | ICD-10-CM

## 2013-07-27 DIAGNOSIS — I4891 Unspecified atrial fibrillation: Principal | ICD-10-CM | POA: Diagnosis present

## 2013-07-27 DIAGNOSIS — G4710 Hypersomnia, unspecified: Secondary | ICD-10-CM | POA: Diagnosis present

## 2013-07-27 DIAGNOSIS — Z6841 Body Mass Index (BMI) 40.0 and over, adult: Secondary | ICD-10-CM

## 2013-07-27 DIAGNOSIS — I10 Essential (primary) hypertension: Secondary | ICD-10-CM | POA: Diagnosis present

## 2013-07-27 DIAGNOSIS — I959 Hypotension, unspecified: Secondary | ICD-10-CM | POA: Diagnosis present

## 2013-07-27 HISTORY — DX: Ventricular tachycardia, unspecified: I47.20

## 2013-07-27 HISTORY — DX: Ventricular tachycardia: I47.2

## 2013-07-27 HISTORY — DX: Acute on chronic diastolic (congestive) heart failure: I50.33

## 2013-07-27 HISTORY — DX: Dilated cardiomyopathy: I42.0

## 2013-07-27 HISTORY — DX: Unspecified atrial fibrillation: I48.91

## 2013-07-27 LAB — TROPONIN I: Troponin I: <0.3 ng/mL (ref <0.30)

## 2013-07-27 LAB — COMPREHENSIVE METABOLIC PANEL
ALT: 15 U/L (ref 0–53)
AST: 14 U/L (ref 0–37)
Albumin: 3.5 g/dL (ref 3.5–5.2)
Alkaline Phosphatase: 84 U/L (ref 39–117)
BILIRUBIN TOTAL: 0.3 mg/dL (ref 0.3–1.2)
BUN: 16 mg/dL (ref 6–23)
CALCIUM: 9.3 mg/dL (ref 8.4–10.5)
CHLORIDE: 103 meq/L (ref 96–112)
CO2: 24 meq/L (ref 19–32)
CREATININE: 0.69 mg/dL (ref 0.50–1.35)
Glucose, Bld: 148 mg/dL — ABNORMAL HIGH (ref 70–99)
Potassium: 4.4 mEq/L (ref 3.7–5.3)
Sodium: 140 mEq/L (ref 137–147)
Total Protein: 7.5 g/dL (ref 6.0–8.3)

## 2013-07-27 LAB — PROTIME-INR
INR: 0.98 (ref 0.00–1.49)
INR: 1.01 (ref 0.00–1.49)
PROTHROMBIN TIME: 12.8 s (ref 11.6–15.2)
PROTHROMBIN TIME: 13.1 s (ref 11.6–15.2)

## 2013-07-27 LAB — BASIC METABOLIC PANEL
BUN: 17 mg/dL (ref 6–23)
CALCIUM: 9.6 mg/dL (ref 8.4–10.5)
CHLORIDE: 102 meq/L (ref 96–112)
CO2: 27 meq/L (ref 19–32)
Creatinine, Ser: 0.7 mg/dL (ref 0.50–1.35)
GFR calc non Af Amer: >90 mL/min (ref >90)
Glucose, Bld: 125 mg/dL — ABNORMAL HIGH (ref 70–99)
Potassium: 4 mEq/L (ref 3.7–5.3)
SODIUM: 144 meq/L (ref 137–147)

## 2013-07-27 LAB — CBC WITH DIFFERENTIAL/PLATELET
BASOS ABS: 0 10*3/uL (ref 0.0–0.1)
BASOS PCT: 0 % (ref 0–1)
Basophils Absolute: 0 10*3/uL (ref 0.0–0.1)
Basophils Relative: 0 % (ref 0–1)
EOS ABS: 0.2 10*3/uL (ref 0.0–0.7)
EOS PCT: 3 % (ref 0–5)
EOS PCT: 2 % (ref 0–5)
Eosinophils Absolute: 0.4 10*3/uL (ref 0.0–0.7)
HCT: 49.4 % (ref 39.0–52.0)
HCT: 45.4 % (ref 39.0–52.0)
Hemoglobin: 17.1 g/dL — ABNORMAL HIGH (ref 13.0–17.0)
Hemoglobin: 16.1 g/dL (ref 13.0–17.0)
LYMPHS ABS: 3.1 10*3/uL (ref 0.7–4.0)
LYMPHS PCT: 29 % (ref 12–46)
Lymphocytes Relative: 29 % (ref 12–46)
Lymphs Abs: 3.5 10*3/uL (ref 0.7–4.0)
MCH: 31.7 pg (ref 26.0–34.0)
MCH: 31.9 pg (ref 26.0–34.0)
MCHC: 34.6 g/dL (ref 30.0–36.0)
MCHC: 35.5 g/dL (ref 30.0–36.0)
MCV: 91.7 fL (ref 78.0–100.0)
MCV: 90.1 fL (ref 78.0–100.0)
Monocytes Absolute: 0.7 10*3/uL (ref 0.1–1.0)
Monocytes Absolute: 0.4 10*3/uL (ref 0.1–1.0)
Monocytes Relative: 6 % (ref 3–12)
Monocytes Relative: 4 % (ref 3–12)
NEUTROS ABS: 7.3 10*3/uL (ref 1.7–7.7)
Neutro Abs: 6.9 10*3/uL (ref 1.7–7.7)
Neutrophils Relative %: 62 % (ref 43–77)
Neutrophils Relative %: 65 % (ref 43–77)
PLATELETS: 223 10*3/uL (ref 150–400)
PLATELETS: 226 10*3/uL (ref 150–400)
RBC: 5.39 MIL/uL (ref 4.22–5.81)
RBC: 5.04 MIL/uL (ref 4.22–5.81)
RDW: 12.3 % (ref 11.5–15.5)
RDW: 12.3 % (ref 11.5–15.5)
WBC: 11.8 10*3/uL — AB (ref 4.0–10.5)
WBC: 10.6 10*3/uL — ABNORMAL HIGH (ref 4.0–10.5)

## 2013-07-27 LAB — APTT
aPTT: 38 seconds — ABNORMAL HIGH (ref 24–37)
aPTT: 37 seconds (ref 24–37)

## 2013-07-27 LAB — TSH: TSH: 1.391 u[IU]/mL (ref 0.350–4.500)

## 2013-07-27 LAB — MRSA PCR SCREENING: MRSA by PCR: NEGATIVE

## 2013-07-27 LAB — PRO B NATRIURETIC PEPTIDE: PRO B NATRI PEPTIDE: 2617 pg/mL — AB (ref 0–125)

## 2013-07-27 MED ORDER — DILTIAZEM HCL 100 MG IV SOLR
5.0000 mg/h | INTRAVENOUS | Status: DC
  Filled 2013-07-27: qty 100

## 2013-07-27 MED ORDER — METOPROLOL TARTRATE 25 MG PO TABS
25.0000 mg | ORAL_TABLET | Freq: Two times a day (BID) | ORAL | Status: DC
  Administered 2013-07-27 – 2013-07-30 (×7): 25 mg via ORAL
  Filled 2013-07-27 (×8): qty 1

## 2013-07-27 MED ORDER — DILTIAZEM HCL 100 MG IV SOLR
5.0000 mg/h | INTRAVENOUS | Status: DC
  Administered 2013-07-27: 15 mg/h via INTRAVENOUS
  Administered 2013-07-27 – 2013-07-28 (×3): 5 mg/h via INTRAVENOUS
  Filled 2013-07-27 (×3): qty 100

## 2013-07-27 MED ORDER — TRAMADOL HCL 50 MG PO TABS
50.0000 mg | ORAL_TABLET | Freq: Four times a day (QID) | ORAL | Status: DC | PRN
  Administered 2013-07-28 – 2013-07-30 (×6): 50 mg via ORAL
  Filled 2013-07-27 (×6): qty 1

## 2013-07-27 MED ORDER — ONDANSETRON HCL 4 MG/2ML IJ SOLN: 4.0000 mg | Freq: Four times a day (QID) | INTRAMUSCULAR | Status: DC | PRN

## 2013-07-27 MED ORDER — RIVAROXABAN 20 MG PO TABS
20.0000 mg | ORAL_TABLET | Freq: Every day | ORAL | Status: DC
  Administered 2013-07-27 – 2013-07-29 (×3): 20 mg via ORAL
  Filled 2013-07-27 (×4): qty 1

## 2013-07-27 MED ORDER — ACETAMINOPHEN 325 MG PO TABS: 650.0000 mg | ORAL_TABLET | ORAL | Status: DC | PRN

## 2013-07-27 NOTE — ED Provider Notes (Addendum)
CSN: [id number]     Arrival date & time 07/27/13  3646 History   First MD Initiated Contact with Patient 07/27/13 [phone]     Chief Complaint  Patient presents with  . Palpitations   (Consider location/radiation/quality/duration/timing/severity/associated sxs/prior Treatment) Patient is a 53 y.o. male presenting with palpitations.  Palpitations  Pt with history of afib and v-tach no longer taking any antidysrhythmics or anticoagulation, reports onset of tachypalpitations yesterday. Associated with nausea but no CP and no SOB. Denies any recent changes in meds but only taking Lisinopril at this time. No recent cardiology followup visits, most recent echo was in 2011 Past Medical History  Diagnosis Date  . Hypertension   . Atrial fibrillation   . Ventricular tachycardia    No past surgical history on file. Family History  Problem Relation Age of Onset  . Diabetes Father    History  Substance Use Topics  . Smoking status: Never Smoker   . Smokeless tobacco: Never Used  . Alcohol Use: No    Review of Systems  Cardiovascular: Positive for palpitations.   All other systems reviewed and are negative except as noted in HPI.   Allergies  Review of patient's allergies indicates no known allergies.  Home Medications   Current Outpatient Rx  Name  Route  Sig  Dispense  Refill  . lisinopril (PRINIVIL,ZESTRIL) 20 MG tablet   Oral   Take 20 mg by mouth daily.         . traMADol (ULTRAM) 50 MG tablet      Take 2 every 12 hours as needed.   120 tablet   2    BP 144/110  Pulse 154  Temp(Src) 98.9 F (37.2 C) (Oral)  Resp 22  SpO2 99% Physical Exam  Nursing note and vitals reviewed. Constitutional: He is oriented to person, place, and time.  Obese  HENT:  Head: Normocephalic and atraumatic.  Eyes: EOM are normal. Pupils are equal, round, and reactive to light.  Neck: Normal range of motion. Neck supple.  Cardiovascular: Normal heart sounds and intact distal pulses.   Tachycardia present.   Pulmonary/Chest: Effort normal and breath sounds normal.  Abdominal: Bowel sounds are normal. He exhibits no distension. There is no tenderness.  Musculoskeletal: Normal range of motion. He exhibits edema (trace BLE). He exhibits no tenderness.  Neurological: He is alert and oriented to person, place, and time. He has normal strength. No cranial nerve deficit or sensory deficit.  Skin: Skin is warm and dry. No rash noted.  Psychiatric: He has a normal mood and affect.    ED Course  Procedures (including critical care time)  CRITICAL CARE Performed by: Pollyann Savoy. Total critical care time: 35 Critical care time was exclusive of separately billable procedures and treating other patients. Critical care was necessary to treat or prevent imminent or life-threatening deterioration. Critical care was time spent personally by me on the following activities: development of treatment plan with patient and/or surrogate as well as nursing, discussions with consultants, evaluation of patient's response to treatment, examination of patient, obtaining history from patient or surrogate, ordering and performing treatments and interventions, ordering and review of laboratory studies, ordering and review of radiographic studies, pulse oximetry and re-evaluation of patient's condition.   Labs Review Labs Reviewed  CBC WITH DIFFERENTIAL - Abnormal; Notable for the following:    WBC 11.8 (*)    Hemoglobin 17.1 (*)    All other components within normal limits  BASIC METABOLIC PANEL - Abnormal; Notable for  the following:    Glucose, Bld 125 (*)    All other components within normal limits  APTT - Abnormal; Notable for the following:    aPTT 38 (*)    All other components within normal limits  TROPONIN I  PROTIME-INR   Imaging Review Dg Chest Port 1 View  07/27/2013   CLINICAL DATA:  Palpitations  EXAM: PORTABLE CHEST - 1 VIEW  COMPARISON:  None.  FINDINGS: Cardiomegaly.  Vascular congestion. No pneumothorax. No consolidation.  IMPRESSION: Cardiomegaly and vascular congestion.   Electronically Signed   By: Maryclare BeanArt  Hoss M.D.   On: 07/27/2013 10:06    EKG Interpretation    Date/Time:  Saturday July 27 2013 09:36:48 EST Ventricular Rate:  159 PR Interval:    QRS Duration: 100 QT Interval:  284 QTC Calculation: 461 R Axis:   -19 Text Interpretation:  Atrial fibrillation with rapid ventricular response Low voltage QRS Cannot rule out Anterior infarct , age undetermined Abnormal ECG Since last tracing Atrial fibrillation with rapid ventricular response has replaced Sinus rhythm Confirmed by SHELDON  MD, CHARLES ([id number]) on 07/27/2013 10:10:37 AM            MDM   1. Atrial fibrillation with rapid ventricular response     PT with afib RVR. Will check labs, plan rate control with Cardizem and Reassess  HR improving, cardizem titrated. Labs and imaging unremarkable. Discussed with Dr. Mayford Knifeurner who [facility].    Charles B. Bernette MayersSheldon, MD 07/27/13 [phone]

## 2013-07-27 NOTE — Consult Note (Signed)
Admit date: 07/27/2013 Referring Physician  Dr Bernette MayersSheldon Primary Physician  Dr. Raynald KempSheard Primary Cardiologist  Dr. Owens LofflerWall/Allred Reason for Consultation  afib with RVR  HPI: This is a 53yo morbidly obese WM with a history of HTN, PAF, nonsustined ventricular tachycardia in the setting of nonischemic DCM and normal coronary arteries by cath 2005 who presented to [facility] with complaints of palpitations.  In review of notes by Dr. Daleen SquibbWall in the past he has been noncompliant with medical therapy and has no longer been taking his heart medication or blood thinners. He apparently has been seen by Dr. Johney FrameAllred in the past although I cannot find any notes.  He had been on Coumadin but stopped it on his own about 4 years ago.   He had also been on amiodarone and metoprolol but stopped those on his own.   He has been exercising swimming and walking and has lost 100 pounds in the past 10 months.  He had onset of palpitations yesterday associated with nausea.  He went to bed on 1/22 and was fine and then awakened with it yesterday am.  He said the palpitations became annoying and he decided to seek medical attention.  He denied any chest pain or SOB.  He has not been seen by Cardiology is many years. His last echo that was able assess any LVF was in 2006 with EF at that time estimated at 30-35%.  [facility] to be in afib with RVR and is now sent for direct admit for further treatment of afib.  He feels fine right now exept for palpitations.     PMH:   Past Medical History  Diagnosis Date  . Hypertension   . 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 Ventricular tachycardia      PSH:   Past Surgical History  Procedure Laterality Date  . Cardiac catheterization  2005    normal coronary arteries  . Pilonidal cyst / sinus excision      Allergies:  Review of patient's allergies indicates no known allergies. Prior to Admit Meds:   Prescriptions prior  to admission  Medication Sig Dispense Refill  . lisinopril (PRINIVIL,ZESTRIL) 20 MG tablet Take 20 mg by mouth daily.      . traMADol (ULTRAM) 50 MG tablet Take 2 every 12 hours as needed.  120 tablet  2   Fam HX:    Family History  Problem Relation Age of Onset  . Diabetes Father   . Arrhythmia Father   . Hypertension Father   . Hypertension Mother   . Diabetes Mother    Social HX:    History   Social History  . Marital Status: Legally Separated    Spouse Name: N/A    Number of Children: N/A  . Years of Education: N/A   Occupational History  . Not on file.   Social History Main Topics  . Smoking status: Never Smoker   . Smokeless tobacco: Never Used  . Alcohol Use: No  . Drug Use: No  . Sexual Activity: Not on file   Other Topics Concern  . Not on file   Social History Narrative  . No narrative on file     ROS:  All 11 ROS were addressed and are negative except what is stated in the HPI  Physical Exam: Blood pressure 157/130, pulse 70, temperature 98.9 F (37.2 C), temperature source Oral, resp.  rate 17, height 5\' 7"  (1.702 m), weight 320 lb (145.151 kg), SpO2 98.00%.    General: Well developed, well nourished, in no acute distress Head: Eyes PERRLA, No xanthomas.   Normal cephalic and atramatic  Lungs:   Clear bilaterally to auscultation and percussion. Heart:   Irregularly irregular S1 S2 Pulses are 2+ & equal.            No carotid bruit. No JVD.  No abdominal bruits. No femoral bruits. Abdomen: Bowel sounds are positive, abdomen soft and non-tender without masses  Extremities:   Chronic venous stasis changes with no edema Neuro: Alert and oriented X 3. Psych:  Good affect, responds appropriately    Labs:   Lab Results  Component Value Date   WBC 11.8* 07/27/2013   HGB 17.1* 07/27/2013   HCT 49.4 07/27/2013   MCV 91.7 07/27/2013   PLT 223 07/27/2013     Recent Labs Lab 07/27/13 0950  NA 144  K 4.0  CL 102  CO2 27  BUN 17  CREATININE 0.70    CALCIUM 9.6  GLUCOSE 125*   No results found for this basename: PTT   Lab Results  Component Value Date   INR 0.98 07/27/2013   INR 1.6 05/10/2010   INR 6.7 ratio* 04/09/2010   PROTIME 26.4 12/08/2008   Lab Results  Component Value Date   CKTOTAL 72 07/06/2009   CKMB 2.7 07/06/2009   TROPONINI <0.30 07/27/2013     Lab Results  Component Value Date   CHOL  Value: 154        ATP III CLASSIFICATION:  <200     mg/dL   Desirable  943-276  mg/dL   Borderline High  >=147    mg/dL   High        0/92/9574   CHOL  Value: 204        ATP III CLASSIFICATION:  <200     mg/dL   Desirable  734-037  mg/dL   Borderline High  >=096    mg/dL   High* 4/38/3818   Lab Results  Component Value Date   HDL 30* 02/11/2009   HDL 40 10/02/2007   Lab Results  Component Value Date   LDLCALC  Value: 101        Total Cholesterol/HDL:CHD Risk Coronary Heart Disease Risk Table                     Men   Women  1/2 Average Risk   3.4   3.3  Average Risk       5.0   4.4  2 X Average Risk   9.6   7.1  3 X Average Risk  23.4   11.0        Use the calculated Patient Ratio above and the CHD Risk Table to determine the patient's CHD Risk.        ATP III CLASSIFICATION (LDL):  <100     mg/dL   Optimal  403-754  mg/dL   Near or Above                    Optimal  130-159  mg/dL   Borderline  360-677  mg/dL   High  >034     mg/dL   Very High* 0/35/2481   LDLCALC  Value: 123        Total Cholesterol/HDL:CHD Risk Coronary Heart Disease Risk Table  Men   Women  1/2 Average Risk   3.4   3.3* 10/02/2007   Lab Results  Component Value Date   TRIG 117 02/11/2009   TRIG 207* 10/02/2007   Lab Results  Component Value Date   CHOLHDL 5.1 02/11/2009   CHOLHDL 5.1 10/02/2007   No results found for this basename: LDLDIRECT      Radiology:  Dg Chest Port 1 View  07/27/2013   CLINICAL DATA:  Palpitations  EXAM: PORTABLE CHEST - 1 VIEW  COMPARISON:  None.  FINDINGS: Cardiomegaly. Vascular congestion. No pneumothorax. No  consolidation.  IMPRESSION: Cardiomegaly and vascular congestion.   Electronically Signed   By: Maryclare Bean M.D.   On: 07/27/2013 10:06    EKG:  Atrial fibrillation at 159bpm with anterior infarct old  ASSESSMENT:  1.  Atrial fibrillation with RVR of unknown duration. 2.  Morbid obesity 3.  Nonischemic DCM 4.  Elevated hemoglobin which I suspect is due to obesity hypoventilation syndrome - porbbably also has OSA 5.  Normal coronary arteries by cath of proximal coronary arteries 2005 6.  HTN with markedly elevated DCB at 157/181mmHg  PLAN:   1.  Admit to step down 2.  IV Cardizem gtt for rate control and BP control 3.  Attempt 2D echo to reassess LVF once HR better controlled 4.  Start Xarelto 20mg  daily 5.  Check TSH 6.  Continue Lisinopril for HTN 7.  Needs outpt sleep study  Quintella Reichert, MD  07/27/2013  2:10 PM

## 2013-07-27 NOTE — Progress Notes (Signed)
Dr. Mayford Knife notified of pt's arrival to the unit

## 2013-07-27 NOTE — ED Notes (Signed)
[facility] notified of room 2C01C--spoke with Sam

## 2013-07-27 NOTE — H&P (Signed)
Admit date: 07/27/2013 Referring Physician  Dr Bernette Mayers Primary Physician  Dr. Raynald Kemp Primary Cardiologist  Dr. Owens Loffler Reason for Consultation  afib with RVR  HPI: This is a 52yo morbidly obese WM with a history of HTN, PAF, nonsustined ventricular tachycardia in the setting of nonischemic DCM and normal coronary arteries by cath 2005 who presented to [facility] with complaints of palpitations.  In review of notes by Dr. Daleen Squibb in the past he has been noncompliant with medical therapy and has no longer been taking his heart medication or blood thinners. He apparently has been seen by Dr. Johney Frame in the past although I cannot find any notes.  He had been on Coumadin but stopped it on his own about 4 years ago.   He had also been on amiodarone and metoprolol but stopped those on his own.   He has been exercising swimming and walking and has lost 100 pounds in the past 10 months.  He had onset of palpitations yesterday associated with nausea.  He went to bed on 1/22 and was fine and then awakened with it yesterday am.  He said the palpitations became annoying and he decided to seek medical attention.  He denied any chest pain or SOB.  He has not been seen by Cardiology is many years. His last echo that was able assess any LVF was in 2006 with EF at that time estimated at 30-35%.  He was found at [facility] to be in afib with RVR and is now sent for direct admit for further treatment of afib.  He feels fine right now exept for palpitations.     PMH:   Past Medical History  Diagnosis Date  . Hypertension   . 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 Ventricular tachycardia      PSH:   Past Surgical History  Procedure Laterality Date  . Cardiac catheterization  2005    normal coronary arteries  . Pilonidal cyst / sinus excision      Allergies:  Review of patient's allergies indicates no known allergies. Prior to Admit Meds:   Prescriptions prior to  admission  Medication Sig Dispense Refill  . lisinopril (PRINIVIL,ZESTRIL) 20 MG tablet Take 20 mg by mouth daily.      . traMADol (ULTRAM) 50 MG tablet Take 2 every 12 hours as needed.  120 tablet  2   Fam HX:    Family History  Problem Relation Age of Onset  . Diabetes Father   . Arrhythmia Father   . Hypertension Father   . Hypertension Mother   . Diabetes Mother    Social HX:    History   Social History  . Marital Status: Legally Separated    Spouse Name: N/A    Number of Children: N/A  . Years of Education: N/A   Occupational History  . Not on file.   Social History Main Topics  . Smoking status: Never Smoker   . Smokeless tobacco: Never Used  . Alcohol Use: No  . Drug Use: No  . Sexual Activity: Not on file   Other Topics Concern  . Not on file   Social History Narrative  . No narrative on file     ROS:  All 11 ROS were addressed and are negative except what is stated in the HPI  Physical Exam: Blood pressure 157/130, pulse 70, temperature 98.9 F (37.2 C), temperature source Oral, resp.  rate 17, height 5\' 7"  (1.702 m), weight 320 lb (145.151 kg), SpO2 98.00%.    General: Well developed, well nourished, in no acute distress Head: Eyes PERRLA, No xanthomas.   Normal cephalic and atramatic  Lungs:   Clear bilaterally to auscultation and percussion. Heart:   Irregularly irregular S1 S2 Pulses are 2+ & equal.            No carotid bruit. No JVD.  No abdominal bruits. No femoral bruits. Abdomen: Bowel sounds are positive, abdomen soft and non-tender without masses  Extremities:   Chronic venous stasis changes with no edema Neuro: Alert and oriented X 3. Psych:  Good affect, responds appropriately    Labs:   Lab Results  Component Value Date   WBC 11.8* 07/27/2013   HGB 17.1* 07/27/2013   HCT 49.4 07/27/2013   MCV 91.7 07/27/2013   PLT 223 07/27/2013     Recent Labs Lab 07/27/13 0950  NA 144  K 4.0  CL 102  CO2 27  BUN 17  CREATININE 0.70   CALCIUM 9.6  GLUCOSE 125*   No results found for this basename: PTT   Lab Results  Component Value Date   INR 0.98 07/27/2013   INR 1.6 05/10/2010   INR 6.7 ratio* 04/09/2010   PROTIME 26.4 12/08/2008   Lab Results  Component Value Date   CKTOTAL 72 07/06/2009   CKMB 2.7 07/06/2009   TROPONINI <0.30 07/27/2013     Lab Results  Component Value Date   CHOL  Value: 154        ATP III CLASSIFICATION:  <200     mg/dL   Desirable  161-096  mg/dL   Borderline High  >=045    mg/dL   High        10/10/8117   CHOL  Value: 204        ATP III CLASSIFICATION:  <200     mg/dL   Desirable  147-829  mg/dL   Borderline High  >=562    mg/dL   High* 08/03/8655   Lab Results  Component Value Date   HDL 30* 02/11/2009   HDL 40 10/02/2007   Lab Results  Component Value Date   LDLCALC  Value: 101        Total Cholesterol/HDL:CHD Risk Coronary Heart Disease Risk Table                     Men   Women  1/2 Average Risk   3.4   3.3  Average Risk       5.0   4.4  2 X Average Risk   9.6   7.1  3 X Average Risk  23.4   11.0        Use the calculated Patient Ratio above and the CHD Risk Table to determine the patient's CHD Risk.        ATP III CLASSIFICATION (LDL):  <100     mg/dL   Optimal  846-962  mg/dL   Near or Above                    Optimal  130-159  mg/dL   Borderline  952-841  mg/dL   High  >324     mg/dL   Very High* 10/03/270   LDLCALC  Value: 123        Total Cholesterol/HDL:CHD Risk Coronary Heart Disease Risk Table  Men   Women  1/2 Average Risk   3.4   3.3* 10/02/2007   Lab Results  Component Value Date   TRIG 117 02/11/2009   TRIG 207* 10/02/2007   Lab Results  Component Value Date   CHOLHDL 5.1 02/11/2009   CHOLHDL 5.1 10/02/2007   No results found for this basename: LDLDIRECT      Radiology:  Dg Chest Port 1 View  07/27/2013   CLINICAL DATA:  Palpitations  EXAM: PORTABLE CHEST - 1 VIEW  COMPARISON:  None.  FINDINGS: Cardiomegaly. Vascular congestion. No pneumothorax. No  consolidation.  IMPRESSION: Cardiomegaly and vascular congestion.   Electronically Signed   By: Maryclare BeanArt  Hoss M.D.   On: 07/27/2013 10:06    EKG:  Atrial fibrillation at 159bpm with anterior infarct old  ASSESSMENT:  1.  Atrial fibrillation with RVR of duration just around 48 hours 2.  Morbid obesity 3.  Nonischemic DCM 4.  Elevated hemoglobin which I suspect is due to obesity hypoventilation syndrome - porbbably also has OSA 5.  Normal coronary arteries by cath of proximal coronary arteries 2005 6.  HTN with markedly elevated DCB at 157/16630mmHg  PLAN:   1.  Admit to step down 2.  IV Cardizem gtt for rate control and BP control 3.  Attempt 2D echo to reassess LVF once HR better controlled 4.  Start Xarelto 20mg  daily 5.  Check TSH 6.  Continue Lisinopril for HTN 7.  Needs outpt sleep study 8.  NPO after MN on Sunday night for possible TEE/DCCV on Monday since unsure if patient is now over 48 hours.  His palpitations started around 48 hours ago.  Quintella ReichertURNER,Derenda Giddings R, MD  07/27/2013  2:10 PM

## 2013-07-27 NOTE — ED Notes (Signed)
Patient here with palpitations since yesterday am. Reports some vague nausea, denies any other associated symptoms. Patient has hx of atrial fib and v-tach

## 2013-07-28 DIAGNOSIS — I5031 Acute diastolic (congestive) heart failure: Secondary | ICD-10-CM | POA: Insufficient documentation

## 2013-07-28 LAB — BASIC METABOLIC PANEL
BUN: 17 mg/dL (ref 6–23)
CHLORIDE: 103 meq/L (ref 96–112)
CO2: 27 mEq/L (ref 19–32)
Calcium: 9.1 mg/dL (ref 8.4–10.5)
Creatinine, Ser: 0.65 mg/dL (ref 0.50–1.35)
GFR calc Af Amer: >90 mL/min (ref >90)
GLUCOSE: 111 mg/dL — AB (ref 70–99)
POTASSIUM: 4.4 meq/L (ref 3.7–5.3)
SODIUM: 139 meq/L (ref 137–147)

## 2013-07-28 LAB — TROPONIN I: Troponin I: <0.3 ng/mL (ref <0.30)

## 2013-07-28 NOTE — Progress Notes (Signed)
SUBJECTIVE:  No complaints this am  OBJECTIVE:   Vitals:   Filed Vitals:   07/27/13 2135 07/27/13 2300 07/28/13 0000 07/28/13 0046  BP: 124/77 119/77 108/67 108/67  Pulse: 93   110  Temp:    98 F (36.7 C)  TempSrc:    Oral  Resp:  20 20 19   Height:      Weight:      SpO2:       I&O's:   Intake/Output Summary (Last 24 hours) at 07/28/13 0750 Last data filed at 07/27/13 2000  Gross per 24 hour  Intake    240 ml  Output    250 ml  Net    -10 ml   TELEMETRY: Reviewed telemetry pt in atrial fibrillation:     PHYSICAL EXAM General: Well developed, well nourished, in no acute distress Head: Eyes PERRLA, No xanthomas.   Normal cephalic and atramatic  Lungs:   Clear bilaterally to auscultation and percussion. Heart:   Irregularly irregular S1 S2 Pulses are 2+ & equal. Abdomen: Bowel sounds are positive, abdomen soft and non-tender without masses  Extremities:   No clubbing, cyanosis or edema.  DP +1 Neuro: Alert and oriented X 3. Psych:  Good affect, responds appropriately   LABS: Basic Metabolic Panel:  Recent Labs  94/80/16 1525 07/28/13 0410  NA 140 139  K 4.4 4.4  CL 103 103  CO2 24 27  GLUCOSE 148* 111*  BUN 16 17  CREATININE 0.69 0.65  CALCIUM 9.3 9.1   Liver Function Tests:  Recent Labs  07/27/13 1525  AST 14  ALT 15  ALKPHOS 84  BILITOT 0.3  PROT 7.5  ALBUMIN 3.5   No results found for this basename: LIPASE, AMYLASE,  in the last 72 hours CBC:  Recent Labs  07/27/13 0950 07/27/13 1525  WBC 11.8* 10.6*  NEUTROABS 7.3 6.9  HGB 17.1* 16.1  HCT 49.4 45.4  MCV 91.7 90.1  PLT 223 226   Cardiac Enzymes:  Recent Labs  07/27/13 1525 07/27/13 1950 07/28/13 0407  TROPONINI <0.30 <0.30 <0.30   BNP: No components found with this basename: POCBNP,  D-Dimer: No results found for this basename: DDIMER,  in the last 72 hours Hemoglobin A1C: No results found for this basename: HGBA1C,  in the last 72 hours Fasting Lipid Panel: No  results found for this basename: CHOL, HDL, LDLCALC, TRIG, CHOLHDL, LDLDIRECT,  in the last 72 hours Thyroid Function Tests:  Recent Labs  07/27/13 1525  TSH 1.391   Anemia Panel: No results found for this basename: VITAMINB12, FOLATE, FERRITIN, TIBC, IRON, RETICCTPCT,  in the last 72 hours Coag Panel:   Lab Results  Component Value Date   INR 1.01 07/27/2013   INR 0.98 07/27/2013   INR 1.6 05/10/2010   PROTIME 26.4 12/08/2008    RADIOLOGY: Dg Chest Port 1 View  07/27/2013   CLINICAL DATA:  Palpitations  EXAM: PORTABLE CHEST - 1 VIEW  COMPARISON:  None.  FINDINGS: Cardiomegaly. Vascular congestion. No pneumothorax. No consolidation.  IMPRESSION: Cardiomegaly and vascular congestion.   Electronically Signed   By: Maryclare Bean M.D.   On: 07/27/2013 10:06    ASSESSMENT:  1. Atrial fibrillation with RVR of duration just around 48 hours - HR borderline control on IV Cardizem gtt when not resting 2. Morbid obesity  3. Nonischemic DCM  4. Elevated hemoglobin which I suspect is due to obesity hypoventilation syndrome - porbbably also has OSA  5. Normal coronary arteries by cath  of proximal coronary arteries 2005  6. HTN improved in Cardizem 7. Acute on chronic diastolic CHF secondary to #1  PLAN:  1. IV Cardizem gtt for rate control and BP control   2. Xarelto started yesterday 3. Continue Lisinopril for HTN  4. Needs outpt sleep study  5. NPO after MN tonight for possible TEE/DCCV on Monday since unsure if patient is now over 48 hours. His palpitations started around 48 hours prior to admit he thinks. 6.  Lasix 40mg  IV today 7.  BMET in am    Quintella ReichertURNER,TRACI R, MD  07/28/2013  7:50 AM

## 2013-07-29 ENCOUNTER — Inpatient Hospital Stay (HOSPITAL_COMMUNITY): Payer: Medicare Other | Admitting: Anesthesiology

## 2013-07-29 ENCOUNTER — Encounter (HOSPITAL_COMMUNITY): Payer: Medicare Other | Admitting: Anesthesiology

## 2013-07-29 ENCOUNTER — Encounter (HOSPITAL_COMMUNITY)
Admission: EM | Disposition: A | Discharge status: Home / Self Care | Payer: Self-pay | Source: Home / Self Care | Attending: Internal Medicine

## 2013-07-29 ENCOUNTER — Encounter (HOSPITAL_COMMUNITY): Payer: Self-pay | Admitting: Cardiology

## 2013-07-29 DIAGNOSIS — I5033 Acute on chronic diastolic (congestive) heart failure: Secondary | ICD-10-CM

## 2013-07-29 DIAGNOSIS — I517 Cardiomegaly: Secondary | ICD-10-CM

## 2013-07-29 HISTORY — DX: Acute on chronic diastolic (congestive) heart failure: I50.33

## 2013-07-29 HISTORY — PX: TEE WITHOUT CARDIOVERSION: SHX5443

## 2013-07-29 HISTORY — PX: CARDIOVERSION: SHX1299

## 2013-07-29 SURGERY — ECHOCARDIOGRAM, TRANSESOPHAGEAL
Anesthesia: Monitor Anesthesia Care

## 2013-07-29 MED ORDER — MIDAZOLAM HCL 10 MG/2ML IJ SOLN
INTRAMUSCULAR | Status: DC | PRN
  Administered 2013-07-29 (×2): 2 mg via INTRAVENOUS
  Administered 2013-07-29: 1 mg via INTRAVENOUS

## 2013-07-29 MED ORDER — FENTANYL CITRATE 0.05 MG/ML IJ SOLN
INTRAMUSCULAR | Status: AC
  Filled 2013-07-29: qty 2

## 2013-07-29 MED ORDER — BUTAMBEN-TETRACAINE-BENZOCAINE 2-2-14 % EX AERO
INHALATION_SPRAY | CUTANEOUS | Status: DC | PRN
  Administered 2013-07-29: 2 via TOPICAL

## 2013-07-29 MED ORDER — MIDAZOLAM HCL 5 MG/ML IJ SOLN
INTRAMUSCULAR | Status: AC
  Filled 2013-07-29: qty 2

## 2013-07-29 MED ORDER — FENTANYL CITRATE 0.05 MG/ML IJ SOLN
INTRAMUSCULAR | Status: DC | PRN
  Administered 2013-07-29 (×2): 25 ug via INTRAVENOUS

## 2013-07-29 MED ORDER — PROPOFOL 10 MG/ML IV BOLUS
INTRAVENOUS | Status: DC | PRN
  Administered 2013-07-29: 40 mg via INTRAVENOUS

## 2013-07-29 MED ORDER — LIDOCAINE HCL (CARDIAC) 20 MG/ML IV SOLN
INTRAVENOUS | Status: DC | PRN
  Administered 2013-07-29: 40 mg via INTRAVENOUS

## 2013-07-29 NOTE — Interval H&P Note (Signed)
History and Physical Interval Note:  07/29/2013 8:33 AM  [name]  has presented today for surgery, with the diagnosis of atrial fibrillation  The various methods of treatment have been discussed with the patient and family. After consideration of risks, benefits and other options for treatment, the patient has consented to  Procedure(s): TRANSESOPHAGEAL ECHOCARDIOGRAM (TEE) (N/A) CARDIOVERSION (N/A) as a surgical intervention .  The patient's history has been reviewed, patient examined, no change in status, stable for surgery.  I have reviewed the patient's chart and labs.  Questions were answered to the patient's satisfaction.     [name]

## 2013-07-29 NOTE — Anesthesia Preprocedure Evaluation (Addendum)
Anesthesia Evaluation  Patient identified by MRN, date of birth, ID bandGeneral Assessment Comment:Sedated from TEE  Reviewed: Allergy & Precautions, H&P , NPO status , Patient's Chart, lab work & pertinent test results, reviewed documented beta blocker date and time   Airway Mallampati: III TM Distance: >3 FB Neck ROM: Full    Dental  (+) Chipped, Teeth Intact and Dental Advisory Given   Pulmonary          Cardiovascular hypertension, Pt. on medications +CHF + dysrhythmias Atrial Fibrillation     Neuro/Psych    GI/Hepatic   Endo/Other    Renal/GU      Musculoskeletal   Abdominal   Peds  Hematology   Anesthesia Other Findings   Reproductive/Obstetrics                           Anesthesia Physical Anesthesia Plan  ASA: III  Anesthesia Plan: MAC   Post-op Pain Management:    Induction: Intravenous  Airway Management Planned: Mask  Additional Equipment:   Intra-op Plan:   Post-operative Plan:   Informed Consent:   Plan Discussed with:   Anesthesia Plan Comments:         Anesthesia Quick Evaluation

## 2013-07-29 NOTE — Progress Notes (Signed)
Pt is alert and oriented x 4. Pt is leaving with an endoscopy nurse, Mercy Moore to go have a TEE and then cardioversion. Pt in no distress at departure. Called ccm to make them aware. Pt left on cardizem drip infusing per md orders.

## 2013-07-29 NOTE — Transfer of Care (Signed)
Immediate Anesthesia Transfer of Care Note  Patient: [name]  Procedure(s) Performed: Procedure(s): TRANSESOPHAGEAL ECHOCARDIOGRAM (TEE) (N/A) CARDIOVERSION (N/A)  Patient Location: Endoscopy Unit  Anesthesia Type:MAC  Level of Consciousness: sedated  Airway & Oxygen Therapy: Patient Spontanous Breathing and Patient connected to nasal cannula oxygen  Post-op Assessment: Report given to PACU RN, Post -op Vital signs reviewed and stable and Patient moving all extremities  Post vital signs: Reviewed and stable  Complications: No apparent anesthesia complications

## 2013-07-29 NOTE — Interval H&P Note (Signed)
History and Physical Interval Note:  07/29/2013 11:01 AM  [name]  has presented today for surgery, with the diagnosis of atrial fibrillation  The various methods of treatment have been discussed with the patient and family. After consideration of risks, benefits and other options for treatment, the patient has consented to  Procedure(s): TRANSESOPHAGEAL ECHOCARDIOGRAM (TEE) (N/A) CARDIOVERSION (N/A) as a surgical intervention .  The patient's history has been reviewed, patient examined, no change in status, stable for surgery.  I have reviewed the patient's chart and labs.  Questions were answered to the patient's satisfaction.     SKAINS, MARK

## 2013-07-29 NOTE — Progress Notes (Signed)
  Echocardiogram 2D Echocardiogram has been performed.  [name] 07/29/2013, 5:09 PM

## 2013-07-29 NOTE — Anesthesia Postprocedure Evaluation (Signed)
  Anesthesia Post-op Note  Patient: [name]  Procedure(s) Performed: Procedure(s): TRANSESOPHAGEAL ECHOCARDIOGRAM (TEE) (N/A) CARDIOVERSION (N/A)  Patient Location: PACU  Anesthesia Type:General  Level of Consciousness: awake, oriented, sedated and patient cooperative  Airway and Oxygen Therapy: Patient Spontanous Breathing  Post-op Pain: none  Post-op Assessment: Post-op Vital signs reviewed, Patient's Cardiovascular Status Stable, Respiratory Function Stable, Patent Airway, No signs of Nausea or vomiting and Pain level controlled  Post-op Vital Signs: stable  Complications: No apparent anesthesia complications

## 2013-07-29 NOTE — Progress Notes (Signed)
  Echocardiogram Echocardiogram Transesophageal has been performed.  Georgian Co 07/29/2013, 11:39 AM

## 2013-07-29 NOTE — H&P (View-Only) (Signed)
         Subjective: No chest pain, no SOB  Objective: Vital signs in last 24 hours: Temp:  [97.3 F (36.3 C)-98.2 F (36.8 C)] 97.3 F (36.3 C) (01/26 0700) Pulse Rate:  [74-101] 74 (01/26 0700) Resp:  [11-17] 17 (01/26 0700) BP: (114-124)/(75-88) 121/88 mmHg (01/26 0700) Weight change:    Intake/Output from previous day: +799 wt 320 down from 321 01/25 0701 - 01/26 0700 In: 635 [P.O.:590; I.V.:45] Out: -  Intake/Output this shift:    PE: General:Pleasant affect, NAD Skin:Warm and dry, brisk capillary refill HEENT:normocephalic, sclera clear, mucus membranes moist Heart:irreg, irreg without murmur, gallup, rub or click Lungs:diminished without rales, rhonchi, or wheezes YBF:XOVAN, soft, non tender, + BS, do not palpate liver spleen or masses Ext:no lower ext edema, discoloration of lower ext. Neuro:alert and oriented, MAE, follows commands, + facial symmetry   Lab Results:  Recent Labs  07/27/13 0950 07/27/13 1525  WBC 11.8* 10.6*  HGB 17.1* 16.1  HCT 49.4 45.4  PLT 223 226   BMET  Recent Labs  07/27/13 1525 07/28/13 0410  NA 140 139  K 4.4 4.4  CL 103 103  CO2 24 27  GLUCOSE 148* 111*  BUN 16 17  CREATININE 0.69 0.65  CALCIUM 9.3 9.1    Recent Labs  07/27/13 1950 07/28/13 0407  TROPONINI <0.30 <0.30       Lab Results  Component Value Date   TSH 1.391 07/27/2013    Hepatic Function Panel  Recent Labs  07/27/13 1525  PROT 7.5  ALBUMIN 3.5  AST 14  ALT 15  ALKPHOS 84  BILITOT 0.3    Studies/Results: Dg Chest Port 1 View  07/27/2013   CLINICAL DATA:  Palpitations  EXAM: PORTABLE CHEST - 1 VIEW  COMPARISON:  None.  FINDINGS: Cardiomegaly. Vascular congestion. No pneumothorax. No consolidation.  IMPRESSION: Cardiomegaly and vascular congestion.   Electronically Signed   By: Maryclare Bean M.D.   On: 07/27/2013 10:06    Medications: I have reviewed the patient's current medications. Scheduled Meds: . metoprolol tartrate  25 mg Oral  BID  . rivaroxaban  20 mg Oral Q supper   Continuous Infusions: . diltiazem (CARDIZEM) infusion Stopped (07/29/13 0540)   PRN Meds:.acetaminophen, ondansetron (ZOFRAN) IV, traMADol  Assessment/Plan: Principal Problem:   Atrial fibrillation with RVR Active Problems:   Morbid obesity   HYPERSOMNIA, ASSOCIATED WITH SLEEP APNEA   HYPERGLYCEMIA   Acute on chronic diastolic HF (heart failure)  PLAN: Negative MI with hx normal Cors on cath 2005. A fib rate controlled on lopressor 25 BID, IV cardizem now off  On Xarelto Pt NPO after MN for possible TEE/DCCV today- not yet scheduled.       LOS: 2 days   Time spent with pt. :15 minutes. [facility] R  Nurse Practitioner Certified Pager [phone] or after 5pm and on weekends call [phone] 07/29/2013, 7:25 AM  Personally seen and examined. Agree with above. Discussed TEE /CV with him, risks and benefits, (esoph. Damage, bleeding, arrhythmia, resp failure.) Questions answered. OK with going forward.   EF 35% in past. Caution. Xarelto Dilt/metop  Donato Schultz, MD

## 2013-07-29 NOTE — Care Management Note (Addendum)
    Page 1 of 1   07/30/2013     12:23:00 PM   CARE MANAGEMENT NOTE 07/30/2013  Patient:  [facility]   Account Number:  [id number]  Date Initiated:  07/29/2013  Documentation initiated by:  Junius Creamer  Subjective/Objective Assessment:   adm w at fib w rvr     Action/Plan:   lives alone   Anticipated DC Date:  07/30/2013   Anticipated DC Plan:  HOME/SELF CARE      DC Planning Services  CM consult  Medication Assistance      Choice offered to / List presented to:             Status of service:   Medicare Important Message given?   (If response is "NO", the following Medicare IM given date fields will be blank) Date Medicare IM given:   Date Additional Medicare IM given:    Discharge Disposition:  HOME/SELF CARE  Per UR Regulation:  Reviewed for med. necessity/level of care/duration of stay  If discussed at Long Length of Stay Meetings, dates discussed:    Comments:  1/27 1221p debbie Puanani Gene rn,bsn spoke w pt and gave him 30day free xarelto card. pt states he does have medicare d to cover meds. he will ck on copay amt when he goes to get 30day free.

## 2013-07-29 NOTE — Progress Notes (Signed)
         Subjective: No chest pain, no SOB  Objective: Vital signs in last 24 hours: Temp:  [97.3 F (36.3 C)-98.2 F (36.8 C)] 97.3 F (36.3 C) (01/26 0700) Pulse Rate:  [74-101] 74 (01/26 0700) Resp:  [11-17] 17 (01/26 0700) BP: (114-124)/(75-88) 121/88 mmHg (01/26 0700) Weight change:    Intake/Output from previous day: +799 wt 320 down from 321 01/25 0701 - 01/26 0700 In: 635 [P.O.:590; I.V.:45] Out: -  Intake/Output this shift:    PE: General:Pleasant affect, NAD Skin:Warm and dry, brisk capillary refill HEENT:normocephalic, sclera clear, mucus membranes moist Heart:irreg, irreg without murmur, gallup, rub or click Lungs:diminished without rales, rhonchi, or wheezes Abd:obese, soft, non tender, + BS, do not palpate liver spleen or masses Ext:no lower ext edema, discoloration of lower ext. Neuro:alert and oriented, MAE, follows commands, + facial symmetry   Lab Results:  Recent Labs  07/27/13 0950 07/27/13 1525  WBC 11.8* 10.6*  HGB 17.1* 16.1  HCT 49.4 45.4  PLT 223 226   BMET  Recent Labs  07/27/13 1525 07/28/13 0410  NA 140 139  K 4.4 4.4  CL 103 103  CO2 24 27  GLUCOSE 148* 111*  BUN 16 17  CREATININE 0.69 0.65  CALCIUM 9.3 9.1    Recent Labs  07/27/13 1950 07/28/13 0407  TROPONINI <0.30 <0.30       Lab Results  Component Value Date   TSH 1.391 07/27/2013    Hepatic Function Panel  Recent Labs  07/27/13 1525  PROT 7.5  ALBUMIN 3.5  AST 14  ALT 15  ALKPHOS 84  BILITOT 0.3    Studies/Results: Dg Chest Port 1 View  07/27/2013   CLINICAL DATA:  Palpitations  EXAM: PORTABLE CHEST - 1 VIEW  COMPARISON:  None.  FINDINGS: Cardiomegaly. Vascular congestion. No pneumothorax. No consolidation.  IMPRESSION: Cardiomegaly and vascular congestion.   Electronically Signed   By: Art  Hoss M.D.   On: 07/27/2013 10:06    Medications: I have reviewed the patient's current medications. Scheduled Meds: . metoprolol tartrate  25 mg Oral  BID  . rivaroxaban  20 mg Oral Q supper   Continuous Infusions: . diltiazem (CARDIZEM) infusion Stopped (07/29/13 0540)   PRN Meds:.acetaminophen, ondansetron (ZOFRAN) IV, traMADol  Assessment/Plan: Principal Problem:   Atrial fibrillation with RVR Active Problems:   Morbid obesity   HYPERSOMNIA, ASSOCIATED WITH SLEEP APNEA   HYPERGLYCEMIA   Acute on chronic diastolic HF (heart failure)  PLAN: Negative MI with hx normal Cors on cath 2005. A fib rate controlled on lopressor 25 BID, IV cardizem now off  On Xarelto Pt NPO after MN for possible TEE/DCCV today- not yet scheduled.       LOS: 2 days   Time spent with pt. :15 minutes. INGOLD,LAURA R  Nurse Practitioner Certified Pager [phone] or after 5pm and on weekends call [phone] 07/29/2013, 7:25 AM  Personally seen and examined. Agree with above. Discussed TEE /CV with him, risks and benefits, (esoph. Damage, bleeding, arrhythmia, resp failure.) Questions answered. OK with going forward.   EF 35% in past. Caution. Xarelto Dilt/metop  Suresh Audi, MD 

## 2013-07-29 NOTE — CV Procedure (Signed)
TEE/CV  AFIB  EF 30% NO LAA thrombus  Cardioversion successful. 200j x 1

## 2013-07-30 ENCOUNTER — Encounter (HOSPITAL_COMMUNITY): Payer: Self-pay | Admitting: Cardiology

## 2013-07-30 DIAGNOSIS — I428 Other cardiomyopathies: Secondary | ICD-10-CM | POA: Diagnosis present

## 2013-07-30 LAB — PRO B NATRIURETIC PEPTIDE: Pro B Natriuretic peptide (BNP): 407.3 pg/mL — ABNORMAL HIGH (ref 0–125)

## 2013-07-30 LAB — HEMOGLOBIN A1C
Hgb A1c MFr Bld: 5.5 % (ref <5.7)
MEAN PLASMA GLUCOSE: 111 mg/dL (ref <117)

## 2013-07-30 LAB — BASIC METABOLIC PANEL
BUN: 18 mg/dL (ref 6–23)
CO2: 29 mEq/L (ref 19–32)
Calcium: 9 mg/dL (ref 8.4–10.5)
Chloride: 101 mEq/L (ref 96–112)
Creatinine, Ser: 0.67 mg/dL (ref 0.50–1.35)
Glucose, Bld: 96 mg/dL (ref 70–99)
Potassium: 4.4 mEq/L (ref 3.7–5.3)
SODIUM: 141 meq/L (ref 137–147)

## 2013-07-30 MED ORDER — LISINOPRIL 5 MG PO TABS
5.0000 mg | ORAL_TABLET | Freq: Every day | ORAL | Status: DC
  Administered 2013-07-30: 5 mg via ORAL
  Filled 2013-07-30: qty 1

## 2013-07-30 MED ORDER — AMIODARONE HCL 200 MG PO TABS: 200.0000 mg | ORAL_TABLET | Freq: Every day | ORAL | Status: DC

## 2013-07-30 MED ORDER — RIVAROXABAN 20 MG PO TABS: 20.0000 mg | ORAL_TABLET | Freq: Every day | ORAL | Status: DC

## 2013-07-30 MED ORDER — LISINOPRIL 5 MG PO TABS: 5.0000 mg | ORAL_TABLET | Freq: Every day | ORAL | Status: DC

## 2013-07-30 MED ORDER — METOPROLOL TARTRATE 25 MG PO TABS: 25.0000 mg | ORAL_TABLET | Freq: Two times a day (BID) | ORAL | Status: DC

## 2013-07-30 MED ORDER — AMIODARONE HCL 200 MG PO TABS
200.0000 mg | ORAL_TABLET | Freq: Every day | ORAL | Status: DC
  Administered 2013-07-30: 200 mg via ORAL
  Filled 2013-07-30: qty 1

## 2013-07-30 MED ORDER — PNEUMOCOCCAL VAC POLYVALENT 25 MCG/0.5ML IJ INJ
0.5000 mL | INJECTION | Freq: Once | INTRAMUSCULAR | Status: AC
  Administered 2013-07-30: 0.5 mL via INTRAMUSCULAR
  Filled 2013-07-30: qty 0.5

## 2013-07-30 NOTE — Discharge Instructions (Signed)
Weigh daily Call [phone] if weight climbs more than 3 pounds in a day or 5 pounds in a week. No salt to very little salt in your diet.  No more than 2000 mg in a day. Call if increased shortness of breath or increased swelling.   Call if your heart is out of rhythm  Heart Healthy low sodium diet   If you notice blood in your stool or urine call the office.  No colonoscopy until cleared by Dr. Johney Frame.  Resume activity.  We added amiodarone to your meds as well. Atrial Fibrillation Atrial fibrillation is a type of irregular heart rhythm (arrhythmia). During atrial fibrillation, the upper chambers of the heart (atria) quiver continuously in a chaotic pattern. This causes an irregular and often rapid heart rate.  Atrial fibrillation is the result of the heart becoming overloaded with disorganized signals that tell it to beat. These signals are normally released one at a time by a part of the right atrium called the sinoatrial node. They then travel from the atria to the lower chambers of the heart (ventricles), causing the atria and ventricles to contract and pump blood as they pass. In atrial fibrillation, parts of the atria outside of the sinoatrial node also release these signals. This results in two problems. First, the atria receive so many signals that they do not have time to fully contract. Second, the ventricles, which can only receive one signal at a time, beat irregularly and out of rhythm with the atria.  There are three types of atrial fibrillation:   Paroxysmal Paroxysmal atrial fibrillation starts suddenly and stops on its own within a week.   Persistent Persistent atrial fibrillation lasts for more than a week. It may stop on its own or with treatment.   Permanent Permanent atrial fibrillation does not go away. Episodes of atrial fibrillation may lead to permanent atrial fibrillation.  Atrial fibrillation can prevent your heart from pumping blood normally. It increases your  risk of stroke and can lead to heart failure.  CAUSES   Heart conditions, including a heart attack, heart failure, coronary artery disease, and heart valve conditions.   Inflammation of the sac that surrounds the heart (pericarditis).   Blockage of an artery in the lungs (pulmonary embolism).   Pneumonia or other infections.   Chronic lung disease.   Thyroid problems, especially if the thyroid is overactive (hyperthyroidism).   Caffeine, excessive alcohol use, and use of some illegal drugs.   Use of some medications, including certain decongestants and diet pills.   Heart surgery.   Birth defects.  Sometimes, no cause can be found. When this happens, the atrial fibrillation is called lone atrial fibrillation. The risk of complications from atrial fibrillation increases if you have lone atrial fibrillation and you are age 23 years or older. RISK FACTORS  Heart failure.  Coronary artery disease  Diabetes mellitus.   High blood pressure (hypertension).   Obesity.   Other arrhythmias.   Increased age. SYMPTOMS   A feeling that your heart is beating rapidly or irregularly.   A feeling of discomfort or pain in your chest.   Shortness of breath.   Sudden lightheadedness or weakness.   Getting tired easily when exercising.   Urinating more often than normal (mainly when atrial fibrillation first begins).  In paroxysmal atrial fibrillation, symptoms may start and suddenly stop. DIAGNOSIS  Your caregiver may be able to detect atrial fibrillation when taking your pulse. Usually, testing is needed to diagnosis atrial fibrillation.  Tests may include:   Electrocardiography. During this test, the electrical impulses of your heart are recorded while you are lying down.   Echocardiography. During echocardiography, sound waves are used to evaluate how blood flows through your heart.   Stress test. There is more than one type of stress test. If a stress  test is needed, ask your caregiver about which type is best for you.   Chest X-ray exam.   Blood tests.   Computed tomography (CT).  TREATMENT   Treating any underlying conditions. For example, if you have an overactive thyroid, treating the condition may correct atrial fibrillation.   Medication. Medications may be given to control a rapid heart rate or to prevent blood clots, heart failure, or a stroke.   Procedure to correct the rhythm of the heart:  Electrical cardioversion. During electrical cardioversion, a controlled, low-energy shock is delivered to the heart through your skin. If you have chest pain, very low pressure blood pressure, or sudden heart failure, this procedure may need to be done as an emergency.  Catheter ablation. During this procedure, heart tissues that send the signals that cause atrial fibrillation are destroyed.  Maze or minimaze procedure. During this surgery, thin lines of heart tissue that carry the abnormal signals are destroyed. The maze procedure is an open-heart surgery. The minimaze procedure is a minimally invasive surgery. This means that small cuts are made to access the heart instead of a large opening.  Pulmonary venous isolation. During this surgery, tissue around the veins that carry blood from the lungs (pulmonary veins) is destroyed. This tissue is thought to carry the abnormal signals. HOME CARE INSTRUCTIONS   Take medications as directed by your caregiver.  Only take medications that your caregiver approves. Some medications can make atrial fibrillation worse or recur.  If blood thinners were prescribed by your caregiver, take them exactly as directed. Too much can cause bleeding. Too little and you will not have the needed protection against stroke and other problems.  Perform blood tests at home if directed by your caregiver.  Perform blood tests exactly as directed.   Quit smoking if you smoke.   Do not drink alcohol.    Do not drink caffeinated beverages such as coffee, soda, and some teas. You may drink decaffeinated coffee, soda, or tea.   Maintain a healthy weight. Do not use diet pills unless your caregiver approves. They may make heart problems worse.   Follow diet instructions as directed by your caregiver.   Exercise regularly as directed by your caregiver.   Keep all follow-up appointments. PREVENTION  The following substances can cause atrial fibrillation to recur:   Caffeinated beverages.   Alcohol.   Certain medications, especially those used for breathing problems.   Certain herbs and herbal medications, such as those containing ephedra or ginseng.  Illegal drugs such as cocaine and amphetamines. Sometimes medications are given to prevent atrial fibrillation from recurring. Proper treatment of any underlying condition is also important in helping prevent recurrence.  SEEK MEDICAL CARE IF:  You notice a change in the rate, rhythm, or strength of your heartbeat.   You suddenly begin urinating more frequently.   You tire more easily when exerting yourself or exercising.  SEEK IMMEDIATE MEDICAL CARE IF:   You develop chest pain, abdominal pain, sweating, or weakness.  You feel sick to your stomach (nauseous).  You develop shortness of breath.  You suddenly develop swollen feet and ankles.  You feel dizzy.  You face  or limbs feel numb or weak.  There is a change in your vision or speech. MAKE SURE YOU:   Understand these instructions.  Will watch your condition.  Will get help right away if you are not doing well or get worse. Document Released: 06/20/2005 Document Revised: 10/15/2012 Document Reviewed: 07/31/2012 [facility] Patient Information 2014 [place], [place].   Information on my medicine - XARELTO (Rivaroxaban)  This medication education was reviewed with me or my healthcare representative as part of my discharge preparation.  The pharmacist that  spoke with me during my hospital stay was:  Link [facility]  Why was Xarelto prescribed for you? Xarelto was prescribed for you to reduce the risk of a blood clots forming that can cause a stroke if you have a medical condition called atrial fibrillation (a type of irregular heartbeat).  What do you need to know about xarelto ? Take your Xarelto ONCE DAILY at the same time every day with your evening meal. If you have difficulty swallowing the tablet whole, you may crush it and mix in applesauce just prior to taking your dose.  Take Xarelto exactly as prescribed by your doctor and DO NOT stop taking Xarelto without talking to the doctor who prescribed the medication.  Stopping without other stroke prevention medication to take the place of Xarelto may increase your risk of developing a clot that causes a stroke.  Refill your prescription before you run out.  After discharge, you should have regular check-up appointments with your healthcare provider that is prescribing your Xarelto.  In the future your dose may need to be changed if your kidney function or weight changes by a significant amount.  What do you do if you miss a dose? If you are taking Xarelto ONCE DAILY and you miss a dose, take it as soon as you remember on the same day then continue your regularly scheduled once daily regimen the next day. Do not take two doses of Xarelto on the same time.   Important Safety Information A possible side effect of Xarelto is bleeding. You should call your healthcare provider right away if you experience any of the following:   Bleeding from an injury or your nose that does not stop.   Unusual colored urine (red or dark brown) or unusual colored stools (red or black).   Unusual bruising for unknown reasons.   A serious fall or if you hit your head (even if there is no bleeding).  Some medicines may interact with Xarelto and might increase your risk of bleeding while on Xarelto. To  help avoid this, consult your healthcare provider or pharmacist prior to using any new prescription or non-prescription medications, including herbals, vitamins, non-steroidal anti-inflammatory drugs (NSAIDs) and supplements.  This website has more information on Xarelto: VisitDestination.com.br.

## 2013-07-30 NOTE — Progress Notes (Signed)
Subjective: No complaints no chest pain no sob  Objective: Vital signs in last 24 hours: Temp:  [97.5 F (36.4 C)-98 F (36.7 C)] 97.5 F (36.4 C) (01/27 0444) Pulse Rate:  [37-126] 75 (01/26 2017) Resp:  [0-23] 20 (01/26 2017) BP: (100-177)/(58-144) 100/58 mmHg (01/27 0444) SpO2:  [94 %-100 %] 96 % (01/26 2017) Weight change:  Last BM Date: 07/29/13 Intake/Output from previous day: -160 (total since admit +629) no weights. 01/26 0701 - 01/27 0700 In: 140 [P.O.:120; I.V.:20] Out: 300 [Urine:300] Intake/Output this shift:    PE: General:Pleasant affect, NAD Skin:Warm and dry, brisk capillary refill HEENT:normocephalic, sclera clear, mucus membranes moist Heart:S1S2 RRR without murmur, gallup, rub or click Lungs:clear to diminished without rales, rhonchi, or wheezes WTU:UEKCM, soft, non tender, + BS, do not palpate liver spleen or masses Ext:no lower ext edema Neuro:alert and oriented X 3, MAE, follows commands, + facial symmetry   Lab Results:  Recent Labs  07/27/13 0950 07/27/13 1525  WBC 11.8* 10.6*  HGB 17.1* 16.1  HCT 49.4 45.4  PLT 223 226   BMET  Recent Labs  07/27/13 1525 07/28/13 0410  NA 140 139  K 4.4 4.4  CL 103 103  CO2 24 27  GLUCOSE 148* 111*  BUN 16 17  CREATININE 0.69 0.65  CALCIUM 9.3 9.1    Recent Labs  07/27/13 1950 07/28/13 0407  TROPONINI <0.30 <0.30     Hepatic Function Panel  Recent Labs  07/27/13 1525  PROT 7.5  ALBUMIN 3.5  AST 14  ALT 15  ALKPHOS 84  BILITOT 0.3      Studies/Results: 2 D echo:  - Left ventricle: Apical window is foreshortened. Makes evaluation of LVEF difficulty. The cavity size was severely dilated. Wall thickness was increased in a pattern of mild LVH. Systolic function was mildly reduced. The estimated ejection fraction was in the range of 45% to 50%. - Left atrium: The atrium was severely dilated. - Right ventricle: The cavity size was mildly dilated. Systolic  function was moderately reduced. - Right atrium: The atrium was mildly to moderately dilated. Impressions:  - Difficult acoustic windows  07/29/13  TEE:  - Left ventricle: The cavity size was mildly dilated. Systolic function was moderately to severely reduced. The estimated ejection fraction was in the range of 30% to 35%. No evidence of thrombus. - Aortic valve: No evidence of vegetation. - Mitral valve: No evidence of vegetation. - Left atrium: The atrium was dilated. No evidence of thrombus in the atrial cavity or appendage. No evidence of thrombus in the atrial cavity or appendage. No evidence of thrombus in the appendage. - Right atrium: No evidence of thrombus in the atrial cavity or appendage. - Atrial septum: No defect or patent foramen ovale was identified. - Tricuspid valve: No evidence of vegetation. - Pulmonic valve: No evidence of vegetation. - Superior vena cava: The study excluded a thrombus. Impressions:  - No cardiac source of emboli was indentified. Successful cardioversion.   Medications: I have reviewed the patient's current medications. Scheduled Meds: . metoprolol tartrate  25 mg Oral BID  . rivaroxaban  20 mg Oral Q supper   Continuous Infusions:  PRN Meds:.acetaminophen, ondansetron (ZOFRAN) IV, traMADol  Assessment/Plan: Principal Problem:   Atrial fibrillation with RVR, s/p DCCV 07/29/13, SR Active Problems:   Morbid obesity   HYPERSOMNIA, ASSOCIATED WITH SLEEP APNEA   HYPERGLYCEMIA   Acute on chronic diastolic HF (heart failure), resolved   NICM (nonischemic  cardiomyopathy), Dx in 2006 and continues  PLAN: s/p DCCV maintaining SR, up in room without complications.  On Xarelto, will recheck BMP and BNP prior to discharge.  Was a pt of Dr. Daleen SquibbWall Also sees Dr. Johney FrameAllred.  Follow up in 2 weeks with Dr. Johney FrameAllred. EF by TEE  30-35%, similar to echo in 2006, but post DCCV EF45-50% which better reflects his activity level.  He swims and walks 2-3 miles per  day.      Currently ACE held secondary to hypotension, BP now 100 to 123 systolic.   He is on BB to prevent A fib.  ? Resume low dose ACE vs. Resume as outpt.   Pt previously had been on amiodarone.  hyperglycemia check hgb A1C. Plan for Home today.  He was scheduled for colonoscopy in 2 weeks, may need to postpone for 6 weeks or so?    LOS: 3 days    [facility] CountyNGOLD,LAURA R  Nurse Practitioner Certified Pager [phone] or after 5pm and on weekends call [phone] 07/30/2013, 8:00 AM Patient seen and examined. I agree with the assessment and plan as detailed above. See also my additional thoughts below.   Patient is stable and ready for discharge. His colonoscopy will be postponed. Low dose ACE inhibitor will be restarted.  Willa RoughJeffrey Forever Arechiga, MD, [facility] 07/30/2013 9:07 AM

## 2013-07-30 NOTE — Discharge Summary (Signed)
Physician Discharge Summary       Patient ID: [name] MRN: [id number] DOB/AGE: 1961-04-26 53 y.o.  Admit date: 07/27/2013 Discharge date: 07/30/2013  Discharge Diagnoses:  Principal Problem:   Atrial fibrillation with RVR, s/p DCCV 07/29/13, SR Active Problems:   Morbid obesity   HYPERSOMNIA, ASSOCIATED WITH SLEEP APNEA   HYPERGLYCEMIA   Acute on chronic diastolic HF (heart failure), resolved   NICM (nonischemic cardiomyopathy), Dx in 2006 and continues   Discharged Condition: good  Procedures: TEE and DCCV by Dr. Anne Fu 07/29/13  Hospital Course: 52yo morbidly obese WM with a history of HTN, PAF, nonsustined ventricular tachycardia in the setting of nonischemic DCM and normal coronary arteries by cath 2005 who presented to [facility] with complaints of palpitations. In review of notes by Dr. Daleen Squibb in the past he has been noncompliant with medical therapy and has no longer been taking his heart medication or blood thinners. He apparently has been seen by Dr. Johney Frame in the past. He had been on Coumadin but stopped it on his own about 4 years ago. He had also been on amiodarone and metoprolol but stopped those on his own. He has been exercising swimming and walking and has lost 100 pounds in the past 10 months. He had onset of palpitations yesterday associated with nausea. He went to bed on 1/22 and was fine and then awakened with it yesterday am. He said the palpitations became annoying and he decided to seek medical attention. He denied any chest pain or SOB. He has not been seen by Cardiology is many years. His last echo that was able assess any LVF was in 2006 with EF at that time estimated at 30-35%. He was found at [facility] to be in afib with RVR and was directly admitted for further treatment of afib. He had mild HF on admit and was given IV lasix, secondary to a. Fib.   Pt was started on Xarelto and Cardizem drip but rate was quickly controlled and Cardizem was stopped.  He was  started on BB but he developed hypotension so ACE was stopped.    By the 26th he had no SOB, and afib continued though rate controlled.  He underwent TEE- no thrombus and then proceeded with DCCV.  He was successfully cardioverted to SR.  EF in A fib was 30-35%, but in SR post cardioversion EF 45-50%.  On the 27th pt maintaining SR.  No complaints, ambulating in room without problems.  Seen and evaluated by Dr. Myrtis Ser and found stable for Discharge.  We have asked him to postpone colonoscopy until cleared by Dr. Johney Frame.  We restarted his lisinopril at a lower dose.   Additionally Dr. Myrtis Ser and pt discussed amiodarone, pt had stopped it himself.  We are resuming at 200 mg daily.      Consults: cardiology  Significant Diagnostic Studies:  BMET    Component Value Date/Time   NA 141 07/30/2013 0930   K 4.4 07/30/2013 0930   CL 101 07/30/2013 0930   CO2 29 07/30/2013 0930   GLUCOSE 96 07/30/2013 0930   BUN 18 07/30/2013 0930   CREATININE 0.67 07/30/2013 0930   CALCIUM 9.0 07/30/2013 0930   GFRNONAA >90 07/30/2013 0930   GFRAA >90 07/30/2013 0930      CBC    Component Value Date/Time   WBC 10.6* 07/27/2013 1525   RBC 5.04 07/27/2013 1525   RBC 3.38* 08/13/2008 0440   HGB 16.1 07/27/2013 1525   HCT 45.4 07/27/2013  1525   PLT 226 07/27/2013 1525   MCV 90.1 07/27/2013 1525   MCH 31.9 07/27/2013 1525   MCHC 35.5 07/27/2013 1525   RDW 12.3 07/27/2013 1525   LYMPHSABS 3.1 07/27/2013 1525   MONOABS 0.4 07/27/2013 1525   EOSABS 0.2 07/27/2013 1525   BASOSABS 0.0 07/27/2013 1525   Negative troponin X 4 <0.30 Pro BNP 2617 on admit. Ordered prior to discharge but not done TSH 1.391  2D Echo: post cardioversion Left ventricle: Apical window is foreshortened. Makes evaluation of LVEF difficulty. The cavity size was severely dilated. Wall thickness was increased in a pattern of mild LVH. Systolic function was mildly reduced. The estimated ejection fraction was in the range of 45% to 50%. - Left atrium: The  atrium was severely dilated. - Right ventricle: The cavity size was mildly dilated. Systolic function was moderately reduced. - Right atrium: The atrium was mildly to moderately dilated. Impressions:  - Difficult acoustic windows   TEE 07/29/13 pre cardioverson - Left ventricle: The cavity size was mildly dilated. Systolic function was moderately to severely reduced. The estimated ejection fraction was in the range of 30% to 35%. No evidence of thrombus. - Aortic valve: No evidence of vegetation. - Mitral valve: No evidence of vegetation. - Left atrium: The atrium was dilated. No evidence of thrombus in the atrial cavity or appendage. No evidence of thrombus in the atrial cavity or appendage. No evidence of thrombus in the appendage. - Right atrium: No evidence of thrombus in the atrial cavity or appendage. - Atrial septum: No defect or patent foramen ovale was identified. - Tricuspid valve: No evidence of vegetation. - Pulmonic valve: No evidence of vegetation. - Superior vena cava: The study excluded a thrombus. Impressions:  - No cardiac source of emboli was indentified. Successful cardioversion.    Discharge Exam: Blood pressure 127/69, pulse 64, temperature 98.1 F (36.7 C), temperature source Oral, resp. rate 12, height 5\' 7"  (1.702 m), weight 320 lb (145.151 kg), SpO2 99.00%.   AM exam:  PE: General:Pleasant affect, NAD  Skin:Warm and dry, brisk capillary refill  HEENT:normocephalic, sclera clear, mucus membranes moist  Heart:S1S2 RRR without murmur, gallup, rub or click  Lungs:clear to diminished without rales, rhonchi, or wheezes  ZDG:UYQIHAbd:obese, soft, non tender, + BS, do not palpate liver spleen or masses  Ext:no lower ext edema  Neuro:alert and oriented X 3, MAE, follows commands, + facial symmetry  Disposition: 01-Home or Self Care   Future Appointments Provider Department Dept Phone   08/13/2013 3:00 PM Herby AbrahamBrooke O Edmisten, PA-C [facility]  [phone]       Medication List         lisinopril 5 MG tablet  Commonly known as:  PRINIVIL,ZESTRIL  Take 1 tablet (5 mg total) by mouth daily.     metoprolol tartrate 25 MG tablet  Commonly known as:  LOPRESSOR  Take 1 tablet (25 mg total) by mouth 2 (two) times daily.     Rivaroxaban 20 MG Tabs tablet  Commonly known as:  XARELTO  Take 1 tablet (20 mg total) by mouth daily with supper.     traMADol 50 MG tablet  Commonly known as:  ULTRAM  Take 100 mg by mouth every 6 (six) hours as needed for moderate pain.           Follow-up Information   Follow up with Hillis RangeJames Allred, MD On 08/13/2013. (at 3:00pm with Dr. Jenel LucksAllred's PA Nehemiah SettleBrooke)    Specialty:  Cardiology  Contact information:   [address] [place] [address] [phone]        Discharge Instructions: Weigh daily Call [phone] if weight climbs more than 3 pounds in a day or 5 pounds in a week. No salt to very little salt in your diet.  No more than 2000 mg in a day. Call if increased shortness of breath or increased swelling.   Call if your heart is out of rhythm  Heart Healthy low sodium diet   If you notice blood in your stool or urine call the office.  No colonoscopy until cleared by Dr. Johney Frame.  Resume activity.  Signed: Leone Brand Nurse Practitioner-Certified [facility]: [facility] 07/30/2013, 10:22 AM  Time spent on discharge  With MD/NP time:  40 minutes.   Patient seen and examined. I agree with the assessment and plan as detailed above. See also my additional thoughts below.   Patient is ready to go home. See my progress note from today.  Willa Rough, MD, [facility] 07/30/2013 11:43 AM

## 2013-07-31 NOTE — Progress Notes (Signed)
Quick Note:  Preliminary report reviewed by triage nurse and sent to MD desk. ______ 

## 2013-08-13 ENCOUNTER — Encounter: Payer: Self-pay | Admitting: Cardiology

## 2013-08-13 ENCOUNTER — Ambulatory Visit (INDEPENDENT_AMBULATORY_CARE_PROVIDER_SITE_OTHER): Payer: Medicare Other | Admitting: Cardiology

## 2013-08-13 VITALS — BP 148/78 | HR 63 | Ht 67.0 in | Wt 325.0 lb

## 2013-08-13 DIAGNOSIS — Z91199 Patient's noncompliance with other medical treatment and regimen due to unspecified reason: Secondary | ICD-10-CM

## 2013-08-13 DIAGNOSIS — Z9119 Patient's noncompliance with other medical treatment and regimen: Secondary | ICD-10-CM

## 2013-08-13 DIAGNOSIS — I428 Other cardiomyopathies: Secondary | ICD-10-CM

## 2013-08-13 DIAGNOSIS — I4891 Unspecified atrial fibrillation: Secondary | ICD-10-CM

## 2013-08-13 MED ORDER — RIVAROXABAN 20 MG PO TABS: 20.0000 mg | ORAL_TABLET | Freq: Every day | ORAL | Status: DC

## 2013-08-13 NOTE — Patient Instructions (Addendum)
Your physician recommends that you schedule a follow-up appointment in: 4 weeks to see Dr. Johney Frame  Your physician has recommended you make the following change in your medication:   1. Start Xarelto 20mg  daily    Your physician recommends that you continue on your current medications as directed. Please refer to the Current Medication list given to you today.

## 2013-08-13 NOTE — Progress Notes (Addendum)
Patient ID: [name] MRN: [id number], DOB/AGE: 09/14/1960   Date of Visit: 08/13/2013  Primary Physician:  Primary Cardiologist: previously Daleen SquibbWall, MD Primary EP: Hillis RangeJames Allred, MD Reason for Visit: Hospital follow-up  History of Present Illness  [name] is a 53 y.o. male with NICM, EF 30-35% diagnosed in 2005, PAF, paroxysmal VT previously on amiodarone, normal coronary arteries s/p cath 2005, HTN, OSA and medical noncompliance who presents today for hospital followup.   [name] last saw Dr. Johney FrameAllred in 2011. He stopped taking all of his medicatons including warfarin, amiodarone and metoprolol about 2 years ago. On 07/27/2013 he developed racing, irregular palpitations which prompted him to go to [facility] ED. He was found to have rapid AFib and mild volume overload. He was admitted and started on Xarelto and rate control. An echo was done revealing LVEF 30-35%. On 07/29/2013 he underwent TEE-guided DCCV which was successful for restoring SR. Repeat echo after DCCV showed LVEF 45-50% in SR. On 07/30/2013 he was discharged home. On the day of discharge amiodarone was added.   Since discharge, he reports he is doing well and has no complaints. He has not had any recurrent palpitations. He denies chest pain or shortness of breath. He denies dizziness, near syncope or syncope. He denies LE swelling, orthopnea, PND or recent weight gain. He has lost ~100 lbs in the last 11 months intentionally with heart healthy diet and exercise. He swims daily and has resumed this since discharge. He reports compliance with his medications except Xarelto which he never picked up from the pharmacy due to cost. [name] states he really does not want to take any blood thinner stating "when I took Coumadin I lived in the doctor's office and I don't want to do that again and I can't afford Xarelto."     Past Medical History Past Medical History  Diagnosis Date  . Hypertension   . Atrial fibrillation   .  Ventricular tachycardia   . Nonischemic dilated cardiomyopathy   . Acute on chronic diastolic HF (heart failure) 07/29/2013    Past Surgical History Past Surgical History  Procedure Laterality Date  . Cardiac catheterization  2005    normal coronary arteries  . Pilonidal cyst / sinus excision    . Tee without cardioversion N/A 07/29/2013    Procedure: TRANSESOPHAGEAL ECHOCARDIOGRAM (TEE);  Surgeon: Donato SchultzMark Skains, MD;  [facility];  Service: Cardiovascular;  Laterality: N/A;  . Cardioversion N/A 07/29/2013    Procedure: CARDIOVERSION;  Surgeon: Donato SchultzMark Skains, MD;  [facility];  Service: Cardiovascular;  Laterality: N/A;  elective cardioversion Lido 40mg , IV and Propofol 40mg ,IV .Marland Kitchen...200 joules synched,  pt now in NSR...tol well...    Allergies/Intolerances No Known Allergies  Current Home Medications Current Outpatient Prescriptions  Medication Sig Dispense Refill  . amiodarone (PACERONE) 200 MG tablet Take 1 tablet (200 mg total) by mouth daily.  30 tablet  6  . lisinopril (PRINIVIL,ZESTRIL) 5 MG tablet Take 10 mg by mouth 2 (two) times daily.      . metoprolol tartrate (LOPRESSOR) 25 MG tablet Take 1 tablet (25 mg total) by mouth 2 (two) times daily.  60 tablet  6  . traMADol (ULTRAM) 50 MG tablet Take 100 mg by mouth every 6 (six) hours as needed for moderate pain.      . Rivaroxaban (XARELTO) 20 MG TABS tablet Take 1 tablet (20 mg total) by mouth daily with supper.  30 tablet  2   No current facility-administered medications for  this visit.    Social History History   Social History  . Marital Status: Legally Separated    Spouse Name: N/A    Number of Children: N/A  . Years of Education: N/A   Occupational History  . Not on file.   Social History Main Topics  . Smoking status: Never Smoker   . Smokeless tobacco: Never Used  . Alcohol Use: No  . Drug Use: No  . Sexual Activity: Not on file   Other Topics Concern  . Not on file   Social History  Narrative  . No narrative on file     Review of Systems General: No chills, fever, night sweats or weight changes Cardiovascular: No chest pain, dyspnea on exertion, edema, orthopnea, palpitations, paroxysmal nocturnal dyspnea Dermatological: No rash, lesions or masses Respiratory: No cough, dyspnea Urologic: No hematuria, dysuria Abdominal: No nausea, vomiting, diarrhea, bright red blood per rectum, melena, or hematemesis Neurologic: No visual changes, weakness, changes in mental status All other systems reviewed and are otherwise negative except as noted above.  Physical Exam Vitals: Blood pressure 148/78, pulse 63, height 5\' 7"  (1.702 m), weight 325 lb (147.419 kg).  General: Well developed, well appearing, overweight 53 y.o. male in no acute distress. HEENT: Normocephalic, atraumatic. EOMs intact. Sclera nonicteric. Oropharynx clear.  Neck: Supple. No JVD. Lungs: Respirations regular and unlabored, CTA bilaterally. No wheezes, rales or rhonchi. Heart: RRR. S1, S2 present. No murmurs, rub, S3 or S4. Abdomen: Soft, non-distended.  Extremities: No clubbing, cyanosis or edema. PT/Radials 2+ and equal bilaterally. Psych: Normal affect. Neuro: Alert and oriented X 3. Moves all extremities spontaneously.   Diagnostics  TEE 07/29/2013 Study Conclusions - Left ventricle: The cavity size was mildly dilated. Systolic function was moderately to severely reduced. The estimated ejection fraction was in the range of 30% to 35%. No evidence of thrombus. - Aortic valve: No evidence of vegetation. - Mitral valve: No evidence of vegetation. - Left atrium: The atrium was dilated. No evidence of thrombus in the atrial cavity or appendage. No evidence of thrombus in the atrial cavity or appendage. No evidence of thrombus in the appendage. - Right atrium: No evidence of thrombus in the atrial cavity or appendage. - Atrial septum: No defect or patent foramen ovale was identified. - Tricuspid  valve: No evidence of vegetation. - Pulmonic valve: No evidence of vegetation. - Superior vena cava: The study excluded a thrombus. Impressions: - No cardiac source of emboli was indentified. Successful cardioversion.  Post DCCV 2D echocardiogram 07/29/2013  Study Conclusions - Left ventricle: Apical window is foreshortened. Makes evaluation of LVEF difficulty. The cavity size was severely dilated. Wall thickness was increased in a pattern of mild LVH. Systolic function was mildly reduced. The estimated ejection fraction was in the range of 45% to 50%. - Left atrium: The atrium was severely dilated. - Right ventricle: The cavity size was mildly dilated. Systolic function was moderately reduced. - Right atrium: The atrium was mildly to moderately dilated. Impressions: - Difficult acoustic windows.  12-lead ECG today - NSR at 62 bpm with nonspecific T wave abnormalities; PR 198, QRS 110, QTc 428  Assessment and Plan  1. Paroxysmal atrial fibrillation - s/p TEE-guided DCCV 07/29/2013 - maintaining SR - CHADS-VASc score is 2 for LV dysfunction and HTN - has been noncompliant with anticoagulation; he was counseled at length regarding the importance of anticoagulation to reduce risk of CVA; he is not sure he can afford Xarelto but after much discussion he would rather  take NOAC instead of Coumadin at this time; I have provided him with samples of Xarelto today for >30 day supply and he agrees to be compliant; in addition, I will ask if case management from [facility] can assist with pricing all NOACs for him (in reviewing his hospital notes, pricing was done only for Xarelto)  - low dose amiodarone (200 mg daily) was added on day of discharge; however, given his young age, using this long term for treatment of AFib is not ideal; on review of his records from 2005, he was previously taking this for VT; I will confer with Dr. Johney Frame but would favor discontinuing amiodarone at this time unless he  develops recurrent VT - return for follow-up with Dr. Johney Frame in 4 weeks  2. NICM - EF 30-35% in AFib, improved to 45-50% in SR - denies HF symptoms - continue medical therapy; consider changing BB therapy to carvedilol  Signed, Marwan Lipe, PA-C 08/13/2013, 5:54 PM  ADDENDUM: I discussed case with Dr. Johney Frame who recommended he continue taking amiodarone at this time. He will keep his scheduled follow-up in March.

## 2013-08-19 ENCOUNTER — Telehealth: Payer: Self-pay | Admitting: Cardiology

## 2013-08-19 NOTE — Telephone Encounter (Signed)
Please see recent office note for hospital follow-up. I discussed case with his primary cardiologist, Dr. Johney Frame, who recommended he continue taking amiodarone for now. I left a message for patient with instructions to continue amiodarone as prescribed at hospital discharge. I requested that he call us back should he have any questions. I also instructed him to keep his follow-up with Dr. Johney Frame as scheduled in March.

## 2013-09-12 ENCOUNTER — Ambulatory Visit (INDEPENDENT_AMBULATORY_CARE_PROVIDER_SITE_OTHER): Payer: Medicare Other | Admitting: Internal Medicine

## 2013-09-12 ENCOUNTER — Encounter: Payer: Self-pay | Admitting: Internal Medicine

## 2013-09-12 VITALS — BP 157/92 | HR 71 | Ht 67.0 in | Wt 335.0 lb

## 2013-09-12 DIAGNOSIS — I420 Dilated cardiomyopathy: Secondary | ICD-10-CM

## 2013-09-12 DIAGNOSIS — I428 Other cardiomyopathies: Secondary | ICD-10-CM

## 2013-09-12 DIAGNOSIS — I4729 Other ventricular tachycardia: Secondary | ICD-10-CM

## 2013-09-12 DIAGNOSIS — I4891 Unspecified atrial fibrillation: Secondary | ICD-10-CM

## 2013-09-12 DIAGNOSIS — I10 Essential (primary) hypertension: Secondary | ICD-10-CM

## 2013-09-12 DIAGNOSIS — I472 Ventricular tachycardia: Secondary | ICD-10-CM

## 2013-09-12 MED ORDER — LISINOPRIL 20 MG PO TABS: ORAL_TABLET | ORAL | Status: DC

## 2013-09-12 NOTE — Progress Notes (Signed)
The patient presents today for routine electrophysiology followup. I havent seen him for a really long time.  It seems that he had been doing well until 1/15 when he developed recurrent afib.  He has lost over 100 lbs and is much more active than previously.  His quality of life is much improved.  He has had no symptoms of VT.  His EF has improved.  Today, he denies symptoms of palpitations, chest pain, shortness of breath, orthopnea, PND, lower extremity edema, dizziness, presyncope, syncope, or neurologic sequela. Since recently being started on metoprolol he has had significant fatigue.  He is frequently washed out. The patient feels that he is tolerating medications without difficulties and is otherwise without complaint today.   Past Medical History  Diagnosis Date  . Hypertension   . Atrial fibrillation   . Ventricular tachycardia   . Nonischemic dilated cardiomyopathy   . Acute on chronic diastolic HF (heart failure) 07/29/2013   Past Surgical History  Procedure Laterality Date  . Cardiac catheterization  2005    normal coronary arteries  . Pilonidal cyst / sinus excision    . Tee without cardioversion N/A 07/29/2013    Procedure: TRANSESOPHAGEAL ECHOCARDIOGRAM (TEE);  Surgeon: Donato Schultz, MD;  Location: [facility] ENDOSCOPY;  Service: Cardiovascular;  Laterality: N/A;  . Cardioversion N/A 07/29/2013    Procedure: CARDIOVERSION;  Surgeon: Donato Schultz, MD;  Location: [facility] ENDOSCOPY;  Service: Cardiovascular;  Laterality: N/A;  elective cardioversion Lido 40mg , IV and Propofol 40mg ,IV .Marland Kitchen..200 joules synched,  pt now in NSR...tol well...    Current Outpatient Prescriptions  Medication Sig Dispense Refill  . amiodarone (PACERONE) 200 MG tablet Take 1 tablet (200 mg total) by mouth daily.  30 tablet  6  . lisinopril (PRINIVIL,ZESTRIL) 20 MG tablet Take 20mg  daily  90 tablet  3  . Rivaroxaban (XARELTO) 20 MG TABS tablet Take 1 tablet (20 mg total) by mouth daily with supper.  30 tablet  2  .  traMADol (ULTRAM) 50 MG tablet Take 100 mg by mouth every 6 (six) hours as needed for moderate pain.       No current facility-administered medications for this visit.    No Known Allergies  History   Social History  . Marital Status: Legally Separated    Spouse Name: N/A    Number of Children: N/A  . Years of Education: N/A   Occupational History  . Not on file.   Social History Main Topics  . Smoking status: Never Smoker   . Smokeless tobacco: Never Used  . Alcohol Use: No  . Drug Use: No  . Sexual Activity: Not on file   Other Topics Concern  . Not on file   Social History Narrative  . No narrative on file    Family History  Problem Relation Age of Onset  . Diabetes Father   . Arrhythmia Father   . Hypertension Father   . Hypertension Mother   . Diabetes Mother     ROS-  All systems are reviewed and are negative except as outlined in the HPI above  Physical Exam: Filed Vitals:   09/12/13 1634  BP: 157/92  Pulse: 71  Height: 5\' 7"  (1.702 m)  Weight: 335 lb (151.955 kg)    GEN- The patient is overweight appearing but has clearly lost a massive amount of weight since I saw him last, alert and oriented x 3 today.   Head- normocephalic, atraumatic Eyes-  Sclera clear, conjunctiva pink Ears- hearing intact Oropharynx-  clear Neck- supple, no JVP Lymph- no cervical lymphadenopathy Lungs- Clear to ausculation bilaterally, normal work of breathing Heart- Regular rate and rhythm, no murmurs, rubs or gallops, PMI not laterally displaced GI- soft, NT, ND, + BS Extremities- no clubbing, cyanosis, trace edema MS- no significant deformity or atrophy Skin- chronic venous stasis changes Psych- euthymic mood, full affect Neuro- strength and sensation are intact  ekg today reveals sinus rhythm 71 bpm, PR 184, QRS 100, QTc 449, poor R wave progression Echo is reviewed Epic records including recent hospitalization and Brooke's notes are reviewed  Assessment and  Plan:  1. afib  Maintaining sinus rhythm chads2vasc score is 2.  He will continue anticoagulation at this time. Continue amiodarone as his AAD options are limited Consider decreasing amiodarone to 100mg  daily upon return if no futher afib Stop metoprolol due to fatigue We may add coreg in the future  2. Nonischemic CM Likely due to afib/ morbid obesity EF has improved Increase lisinopril today to 20mg  daily bmet upon return Consider adding coreg upon return  3. htn Above goal As above  4. VT Well controlled with amiodarone Consider decreasing amiodarone to 100mg  daily upon return  5. Obesity I am very pleased with his progress.  Ongoing weight reduction is advised  Return to see me in 3 months

## 2013-09-12 NOTE — Patient Instructions (Signed)
Your physician recommends that you schedule a follow-up appointment in: 3 months with Dr Johney Frame   Your physician has recommended you make the following change in your medication:  1) Increase Lisinopril 20mg  daily 2) Stop Metoprolol

## 2013-09-16 ENCOUNTER — Telehealth: Payer: Self-pay | Admitting: *Deleted

## 2013-09-16 NOTE — Telephone Encounter (Signed)
PA to [facility] for patients xarelto

## 2013-09-17 NOTE — Telephone Encounter (Signed)
[facility] denies xarelto due to not on formulary, the formulary medications are:  eliquis and pradaxa Will route a note to Dr Johney Frame

## 2013-09-19 ENCOUNTER — Telehealth: Payer: Self-pay | Admitting: Internal Medicine

## 2013-09-19 NOTE — Telephone Encounter (Signed)
New message     Ins will not pay for xarelto but will pay for eliquis or prodaxa,  Will it be OK for pt to take one of these drugs?  Pls let pt know what dr says.   [facility]

## 2013-09-19 NOTE — Telephone Encounter (Signed)
Called patient to advise him that message has been forwarded to Dr. Johney Frame who will return to the office Monday.  Patient states he believes he has enough medication to last until Monday.  I advised patient that if he needs more medication to call back so that we can put some samples up front for him.  Patient verbalized agreement and understanding.

## 2013-09-23 MED ORDER — APIXABAN 5 MG PO TABS: 5.0000 mg | ORAL_TABLET | Freq: Two times a day (BID) | ORAL | Status: DC

## 2013-09-23 NOTE — Telephone Encounter (Signed)
I left a message on the patient's identified voice mail that Dr. Johney Frame recommends changing to Eliquis 5 mg BID and this is being sent to [facility]. I asked that he call back with any questions.

## 2013-12-25 ENCOUNTER — Ambulatory Visit (INDEPENDENT_AMBULATORY_CARE_PROVIDER_SITE_OTHER): Payer: Medicare Other | Admitting: Internal Medicine

## 2013-12-25 ENCOUNTER — Encounter: Payer: Self-pay | Admitting: Internal Medicine

## 2013-12-25 VITALS — BP 147/91 | HR 70 | Ht 67.0 in | Wt 318.0 lb

## 2013-12-25 DIAGNOSIS — I428 Other cardiomyopathies: Secondary | ICD-10-CM

## 2013-12-25 DIAGNOSIS — I420 Dilated cardiomyopathy: Secondary | ICD-10-CM

## 2013-12-25 DIAGNOSIS — I4891 Unspecified atrial fibrillation: Secondary | ICD-10-CM

## 2013-12-25 DIAGNOSIS — I10 Essential (primary) hypertension: Secondary | ICD-10-CM

## 2013-12-25 DIAGNOSIS — I472 Ventricular tachycardia, unspecified: Secondary | ICD-10-CM

## 2013-12-25 DIAGNOSIS — I4819 Other persistent atrial fibrillation: Secondary | ICD-10-CM

## 2013-12-25 DIAGNOSIS — I4729 Other ventricular tachycardia: Secondary | ICD-10-CM

## 2013-12-25 MED ORDER — AMIODARONE HCL 200 MG PO TABS: 100.0000 mg | ORAL_TABLET | Freq: Every day | ORAL | Status: DC

## 2013-12-25 NOTE — Patient Instructions (Signed)
Your physician recommends that you schedule a follow-up appointment in: 3 months with Dr Johney Frame  Your physician has recommended you make the following change in your medication:  1) Decrease Amiodarone to 100mg  daily 2) Stop Eliquis

## 2013-12-28 NOTE — Progress Notes (Signed)
The patient presents today for routine electrophysiology followup. He has lost over 100 lbs and is much more active than previously.  His quality of life is much improved.  He says that he feels like "a different human".   He has had no symptoms of arrhythmia since I saw him last.  Today, he denies symptoms of palpitations, chest pain, shortness of breath, orthopnea, PND, lower extremity edema, dizziness, presyncope, syncope, or neurologic sequela.   The patient feels that he is tolerating medications without difficulties and is otherwise without complaint today.   Past Medical History  Diagnosis Date  . Hypertension   . Atrial fibrillation   . Ventricular tachycardia   . Nonischemic dilated cardiomyopathy   . Acute on chronic diastolic HF (heart failure) 07/29/2013   Past Surgical History  Procedure Laterality Date  . Cardiac catheterization  2005    normal coronary arteries  . Pilonidal cyst / sinus excision    . Tee without cardioversion N/A 07/29/2013    Procedure: TRANSESOPHAGEAL ECHOCARDIOGRAM (TEE);  Surgeon: Donato Schultz, MD;  Location: [facility] ENDOSCOPY;  Service: Cardiovascular;  Laterality: N/A;  . Cardioversion N/A 07/29/2013    Procedure: CARDIOVERSION;  Surgeon: Donato Schultz, MD;  [facility] ENDOSCOPY;  Service: Cardiovascular;  Laterality: N/A;  elective cardioversion Lido 40mg , IV and Propofol 40mg ,IV .Marland Kitchen..200 joules synched,  pt now in NSR...tol well...    Current Outpatient Prescriptions  Medication Sig Dispense Refill  . amiodarone (PACERONE) 200 MG tablet Take 0.5 tablets (100 mg total) by mouth daily.  30 tablet  6  . lisinopril (PRINIVIL,ZESTRIL) 20 MG tablet Take 20mg  daily  90 tablet  3  . traMADol (ULTRAM) 50 MG tablet Take 100 mg by mouth every 6 (six) hours as needed for moderate pain.       No current facility-administered medications for this visit.    No Known Allergies  History   Social History  . Marital Status: Legally Separated    Spouse Name: N/A   Number of Children: N/A  . Years of Education: N/A   Occupational History  . Not on file.   Social History Main Topics  . Smoking status: Never Smoker   . Smokeless tobacco: Never Used  . Alcohol Use: No  . Drug Use: No  . Sexual Activity: Not on file   Other Topics Concern  . Not on file   Social History Narrative  . No narrative on file    Family History  Problem Relation Age of Onset  . Diabetes Father   . Arrhythmia Father   . Hypertension Father   . Hypertension Mother   . Diabetes Mother     ROS-  All systems are reviewed and are negative except as outlined in the HPI above  Physical Exam: Filed Vitals:   12/25/13 1702  BP: 147/91  Pulse: 70  Height: 5\' 7"  (1.702 m)  Weight: 318 lb (144.244 kg)    GEN- The patient is overweight appearing but has clearly lost a massive amount of weight since I saw him last, alert and oriented x 3 today.   Head- normocephalic, atraumatic Eyes-  Sclera clear, conjunctiva pink Ears- hearing intact Oropharynx- clear Neck- supple,   Lungs- Clear to ausculation bilaterally, normal work of breathing Heart- Regular rate and rhythm, no murmurs, rubs or gallops, PMI not laterally displaced GI- soft, NT, ND, + BS Extremities- no clubbing, cyanosis, trace edema, chronic venous stasis changes MS- no significant deformity or atrophy Skin- chronic venous stasis changes Psych-  euthymic mood, full affect Neuro- strength and sensation are intact  ekg today reveals sinus rhythm 70 bpm   Assessment and Plan:  1. afib  Maintaining sinus rhythm chads2vasc score is 2 but he is clear that he is not interested in anticoagulation at this time.  He has stopped anticoagulation. Decrease amiodarone to 100mg  daily today.  2. Nonischemic CM Likely due to afib/ morbid obesity EF has improved bmet upon return Consider adding coreg upon return if BP is elevated  3. htn Stable No change required today  4. VT Well controlled with  amiodarone Decrease amiodarone to 100mg  daily  5. Obesity I am very pleased with his progress.  Ongoing weight reduction is advised  Return to see me in 3 months

## 2014-01-17 ENCOUNTER — Ambulatory Visit (INDEPENDENT_AMBULATORY_CARE_PROVIDER_SITE_OTHER): Payer: Medicare Other | Admitting: Podiatry

## 2014-01-17 ENCOUNTER — Encounter: Payer: Self-pay | Admitting: Podiatry

## 2014-01-17 VITALS — BP 159/87 | HR 77 | Ht 67.0 in | Wt 318.0 lb

## 2014-01-17 DIAGNOSIS — M25579 Pain in unspecified ankle and joints of unspecified foot: Secondary | ICD-10-CM

## 2014-01-17 DIAGNOSIS — M10072 Idiopathic gout, left ankle and foot: Secondary | ICD-10-CM

## 2014-01-17 DIAGNOSIS — M25572 Pain in left ankle and joints of left foot: Secondary | ICD-10-CM

## 2014-01-17 DIAGNOSIS — M109 Gout, unspecified: Secondary | ICD-10-CM

## 2014-01-17 MED ORDER — OXYCODONE-ACETAMINOPHEN 7.5-325 MG PO TABS: 1.0000 | ORAL_TABLET | Freq: Two times a day (BID) | ORAL | Status: DC | PRN

## 2014-01-17 MED ORDER — TRAMADOL HCL 50 MG PO TABS: 100.0000 mg | ORAL_TABLET | Freq: Four times a day (QID) | ORAL | Status: DC | PRN

## 2014-01-17 NOTE — Progress Notes (Signed)
Subjective: Left ankle start acting up. No more problem with right heel.  Now he lost 130 lbs. Still continues with walking and exercising.   Objective:  All pedal pulses are palpable.  Continue losing weight with exercise and diet.  No open skin lesions.  Pain under the right heel.  X-rays taken: No acute changes seen on left heel.  Both heels have plantar calcaneal spur.  Positive of first ray elevatus.   Assessment: Existing pseudo gout left ankle.  Plan: Patient wants to have pain medication.

## 2014-01-17 NOTE — Patient Instructions (Signed)
Seen for acute gouty flare up on left ankle. Medication re-ordered.

## 2014-03-19 ENCOUNTER — Encounter: Payer: Self-pay | Admitting: Internal Medicine

## 2014-03-19 ENCOUNTER — Ambulatory Visit (INDEPENDENT_AMBULATORY_CARE_PROVIDER_SITE_OTHER): Payer: Medicare HMO | Admitting: Internal Medicine

## 2014-03-19 VITALS — BP 150/98 | HR 105 | Ht 67.0 in | Wt 322.0 lb

## 2014-03-19 DIAGNOSIS — I4891 Unspecified atrial fibrillation: Secondary | ICD-10-CM

## 2014-03-19 DIAGNOSIS — I4729 Other ventricular tachycardia: Secondary | ICD-10-CM

## 2014-03-19 DIAGNOSIS — I428 Other cardiomyopathies: Secondary | ICD-10-CM

## 2014-03-19 DIAGNOSIS — I472 Ventricular tachycardia, unspecified: Secondary | ICD-10-CM

## 2014-03-19 DIAGNOSIS — I420 Dilated cardiomyopathy: Secondary | ICD-10-CM

## 2014-03-19 DIAGNOSIS — I4819 Other persistent atrial fibrillation: Secondary | ICD-10-CM

## 2014-03-19 MED ORDER — LISINOPRIL 20 MG PO TABS: ORAL_TABLET | ORAL | Status: AC

## 2014-03-19 MED ORDER — AMIODARONE HCL 100 MG PO TABS: 100.0000 mg | ORAL_TABLET | Freq: Every day | ORAL | Status: AC

## 2014-03-19 NOTE — Progress Notes (Signed)
The patient presents today for routine electrophysiology followup. He has lost over 100 lbs and is much more active than previously.  His quality of life is much improved.  His weight is stable since last visit.  He has had no symptoms of arrhythmia since I saw him last.  His primary concern today is with back pain.  Today, he denies symptoms of palpitations, chest pain, shortness of breath, orthopnea, PND, lower extremity edema, dizziness, presyncope, syncope, or neurologic sequela.   The patient feels that he is tolerating medications without difficulties and is otherwise without complaint today.   Past Medical History  Diagnosis Date  . Hypertension   . Atrial fibrillation   . Ventricular tachycardia   . Nonischemic dilated cardiomyopathy   . Acute on chronic diastolic HF (heart failure) 07/29/2013   Past Surgical History  Procedure Laterality Date  . Cardiac catheterization  2005    normal coronary arteries  . Pilonidal cyst / sinus excision    . Tee without cardioversion N/A 07/29/2013    Procedure: TRANSESOPHAGEAL ECHOCARDIOGRAM (TEE);  Surgeon: Donato Schultz, MD;  Location: [facility] ENDOSCOPY;  Service: Cardiovascular;  Laterality: N/A;  . Cardioversion N/A 07/29/2013    Procedure: CARDIOVERSION;  Surgeon: Donato Schultz, MD;  Location: [facility] ENDOSCOPY;  Service: Cardiovascular;  Laterality: N/A;  elective cardioversion Lido , IV and Propofol ,IV .Marland Kitchen..200 joules synched,  pt now in NSR...tol well...    Current Outpatient Prescriptions  Medication Sig Dispense Refill  . lisinopril (PRINIVIL,ZESTRIL) 20 MG tablet Take  daily  90 tablet  3  . traMADol (ULTRAM) 50 MG tablet Take 2 tablets (100 mg total) by mouth every 6 (six) hours as needed for moderate pain.  90 tablet  1  . amiodarone (PACERONE) 100 MG tablet Take 1 tablet (100 mg total) by mouth daily.  90 tablet  1   No current facility-administered medications for this visit.    No Known Allergies  History   Social History    . Marital Status: Legally Separated    Spouse Name: N/A    Number of Children: N/A  . Years of Education: N/A   Occupational History  . Not on file.   Social History Main Topics  . Smoking status: Current Every Day Smoker    Types: Cigars  . Smokeless tobacco: Never Used     Comment: smokes cigars, he is not ready to quit  . Alcohol Use: No  . Drug Use: No  . Sexual Activity: Not on file   Other Topics Concern  . Not on file   Social History Narrative  . No narrative on file    Family History  Problem Relation Age of Onset  . Diabetes Father   . Arrhythmia Father   . Hypertension Father   . Hypertension Mother   . Diabetes Mother     ROS-  All systems are reviewed and are negative except as outlined in the HPI above  Physical Exam: Filed Vitals:   03/19/14 1643  BP: 150/98  Pulse: 105  Height:  (1.702 m)  Weight: 322 lb (146.058 kg)    GEN- The patient is overweight appearing but has clearly lost a massive amount of weight since I saw him last, alert and oriented x 3 today.   Head- normocephalic, atraumatic Eyes-  Sclera clear, conjunctiva pink Ears- hearing intact Oropharynx- clear Neck- supple,   Lungs- Clear to ausculation bilaterally, normal work of breathing Heart- Regular rate and rhythm, no murmurs, rubs or gallops,  PMI not laterally displaced GI- soft, NT, ND, + BS Extremities- no clubbing, cyanosis, trace edema, chronic venous stasis changes MS- no significant deformity or atrophy Skin- chronic venous stasis changes Psych- euthymic mood, full affect Neuro- strength and sensation are intact  ekg today reveals sinus rhythm 105 bpm   Assessment and Plan:  1. afib  Maintaining sinus rhythm chads2vasc score is 2 but he is clear that he is not interested in anticoagulation at this time.  Continue amiodarone 100mg  daily for now  2. Nonischemic CM Likely due to afib/ morbid obesity EF has improved bmet upon return Consider adding coreg  upon return if BP is elevated  3. htn Stable No change required today  4. VT Well controlled with amiodarone  5. Obesity I am very pleased with his progress.  Ongoing weight reduction is advised  6. Sinus tachycardia He attributes to back pain  Return to see me in 3 months Bmet, LFTs, TFTs upon return

## 2014-03-19 NOTE — Patient Instructions (Signed)
Your physician recommends that you schedule a follow-up appointment in: 3 months with Dr Johney Frame    Smoking Cessation Quitting smoking is important to your health and has many advantages. However, it is not always easy to quit since nicotine is a very addictive drug. Oftentimes, people try 3 times or more before being able to quit. This document explains the best ways for you to prepare to quit smoking. Quitting takes hard work and a lot of effort, but you can do it. ADVANTAGES OF QUITTING SMOKING  You will live longer, feel better, and live better.  Your body will feel the impact of quitting smoking almost immediately.  Within 20 minutes, blood pressure decreases. Your pulse returns to its normal level.  After 8 hours, carbon monoxide levels in the blood return to normal. Your oxygen level increases.  After 24 hours, the chance of having a heart attack starts to decrease. Your breath, hair, and body stop smelling like smoke.  After 48 hours, damaged nerve endings begin to recover. Your sense of taste and smell improve.  After 72 hours, the body is virtually free of nicotine. Your bronchial tubes relax and breathing becomes easier.  After 2 to 12 weeks, lungs can hold more air. Exercise becomes easier and circulation improves.  The risk of having a heart attack, stroke, cancer, or lung disease is greatly reduced.  After 1 year, the risk of coronary heart disease is cut in half.  After 5 years, the risk of stroke falls to the same as a nonsmoker.  After 10 years, the risk of lung cancer is cut in half and the risk of other cancers decreases significantly.  After 15 years, the risk of coronary heart disease drops, usually to the level of a nonsmoker.  If you are pregnant, quitting smoking will improve your chances of having a healthy baby.  The people you live with, especially any children, will be healthier.  You will have extra money to spend on things other than  cigarettes. QUESTIONS TO THINK ABOUT BEFORE ATTEMPTING TO QUIT You may want to talk about your answers with your health care provider.  Why do you want to quit?  If you tried to quit in the past, what helped and what did not?  What will be the most difficult situations for you after you quit? How will you plan to handle them?  Who can help you through the tough times? Your family? Friends? A health care provider?  What pleasures do you get from smoking? What ways can you still get pleasure if you quit? Here are some questions to ask your health care provider:  How can you help me to be successful at quitting?  What medicine do you think would be best for me and how should I take it?  What should I do if I need more help?  What is smoking withdrawal like? How can I get information on withdrawal? GET READY  Set a quit date.  Change your environment by getting rid of all cigarettes, ashtrays, matches, and lighters in your home, car, or work. Do not let people smoke in your home.  Review your past attempts to quit. Think about what worked and what did not. GET SUPPORT AND ENCOURAGEMENT You have a better chance of being successful if you have help. You can get support in many ways.  Tell your family, friends, and coworkers that you are going to quit and need their support. Ask them not to smoke around you.  Get individual, group, or telephone counseling and support. Programs are available at [employer] and health centers. Call your local health department for information about programs in your area.  Spiritual beliefs and practices may help some smokers quit.  Download a "quit meter" on your computer to keep track of quit statistics, such as how long you have gone without smoking, cigarettes not smoked, and money saved.  Get a self-help book about quitting smoking and staying off tobacco. LEARN NEW SKILLS AND BEHAVIORS  Distract yourself from urges to smoke. Talk to  someone, go for a walk, or occupy your time with a task.  Change your normal routine. Take a different route to work. Drink tea instead of coffee. Eat breakfast in a different place.  Reduce your stress. Take a hot bath, exercise, or read a book.  Plan something enjoyable to do every day. Reward yourself for not smoking.  Explore interactive web-based programs that specialize in helping you quit. GET MEDICINE AND USE IT CORRECTLY Medicines can help you stop smoking and decrease the urge to smoke. Combining medicine with the above behavioral methods and support can greatly increase your chances of successfully quitting smoking.  Nicotine replacement therapy helps deliver nicotine to your body without the negative effects and risks of smoking. Nicotine replacement therapy includes nicotine gum, lozenges, inhalers, nasal sprays, and skin patches. Some may be available over-the-counter and others require a prescription.  Antidepressant medicine helps people abstain from smoking, but how this works is unknown. This medicine is available by prescription.  Nicotinic receptor partial agonist medicine simulates the effect of nicotine in your brain. This medicine is available by prescription. Ask your health care provider for advice about which medicines to use and how to use them based on your health history. Your health care provider will tell you what side effects to look out for if you choose to be on a medicine or therapy. Carefully read the information on the package. Do not use any other product containing nicotine while using a nicotine replacement product.  RELAPSE OR DIFFICULT SITUATIONS Most relapses occur within the first 3 months after quitting. Do not be discouraged if you start smoking again. Remember, most people try several times before finally quitting. You may have symptoms of withdrawal because your body is used to nicotine. You may crave cigarettes, be irritable, feel very hungry, cough  often, get headaches, or have difficulty concentrating. The withdrawal symptoms are only temporary. They are strongest when you first quit, but they will go away within 10-14 days. To reduce the chances of relapse, try to:  Avoid drinking alcohol. Drinking lowers your chances of successfully quitting.  Reduce the amount of caffeine you consume. Once you quit smoking, the amount of caffeine in your body increases and can give you symptoms, such as a rapid heartbeat, sweating, and anxiety.  Avoid smokers because they can make you want to smoke.  Do not let weight gain distract you. Many smokers will gain weight when they quit, usually less than 10 pounds. Eat a healthy diet and stay active. You can always lose the weight gained after you quit.  Find ways to improve your mood other than smoking. FOR MORE INFORMATION  www.smokefree.gov  Document Released: 06/14/2001 Document Revised: 11/04/2013 Document Reviewed: 09/29/2011 [facility] Patient Information 2015 [place], [place]. This information is not intended to replace advice given to you by your health care provider. Make sure you discuss any questions you have with your health care provider.

## 2014-03-20 LAB — LIPID PANEL
Cholesterol: 243 mg/dL — ABNORMAL HIGH (ref 0–200)
HDL: 50.5 mg/dL (ref >39.00)
LDL Cholesterol: 158 mg/dL — ABNORMAL HIGH (ref 0–99)
NONHDL: 192.5
Total CHOL/HDL Ratio: 5
Triglycerides: 175 mg/dL — ABNORMAL HIGH (ref 0.0–149.0)
VLDL: 35 mg/dL (ref 0.0–40.0)

## 2014-03-20 LAB — HEPATIC FUNCTION PANEL
ALK PHOS: 77 U/L (ref 39–117)
ALT: 16 U/L (ref 0–53)
AST: 18 U/L (ref 0–37)
Albumin: 3.7 g/dL (ref 3.5–5.2)
BILIRUBIN TOTAL: 0.6 mg/dL (ref 0.2–1.2)
Bilirubin, Direct: 0.1 mg/dL (ref 0.0–0.3)
Total Protein: 7.8 g/dL (ref 6.0–8.3)

## 2014-03-20 LAB — TSH: TSH: 0.45 u[IU]/mL (ref 0.35–4.50)

## 2014-05-20 ENCOUNTER — Encounter: Payer: Self-pay | Admitting: Podiatry

## 2014-05-20 ENCOUNTER — Ambulatory Visit (INDEPENDENT_AMBULATORY_CARE_PROVIDER_SITE_OTHER): Payer: Medicare HMO | Admitting: Podiatry

## 2014-05-20 VITALS — BP 192/102 | HR 78

## 2014-05-20 DIAGNOSIS — M722 Plantar fascial fibromatosis: Secondary | ICD-10-CM

## 2014-05-20 DIAGNOSIS — M25579 Pain in unspecified ankle and joints of unspecified foot: Secondary | ICD-10-CM

## 2014-05-20 MED ORDER — OXYCODONE-ACETAMINOPHEN 7.5-325 MG PO TABS: 1.0000 | ORAL_TABLET | Freq: Four times a day (QID) | ORAL | Status: DC | PRN

## 2014-05-20 MED ORDER — TRAMADOL HCL 50 MG PO TABS: 100.0000 mg | ORAL_TABLET | Freq: Four times a day (QID) | ORAL | Status: DC | PRN

## 2014-05-20 NOTE — Progress Notes (Signed)
Subjective: Left heel medial aspect start acting. Now he weighs 328 lbs. He continues with walking and swimming exercisi to loose weight.   Objective:  All pedal pulses are palpable.  Continue losing weight with exercise and diet.  No open skin lesions.  Pain at plantar medial left heel.   Assessment:  Rule out gout left ankle. Plantar fasciitis left.   Plan: Patient wants to have pain medication.  Prescription written as per request.

## 2014-05-20 NOTE — Patient Instructions (Signed)
Seen for left heel pain. Prescription written as per request. Return as needed.

## 2014-06-23 ENCOUNTER — Ambulatory Visit: Payer: Medicare Other | Admitting: Internal Medicine

## 2014-09-08 ENCOUNTER — Telehealth: Payer: Self-pay | Admitting: *Deleted

## 2014-09-08 MED ORDER — OXYCODONE-ACETAMINOPHEN 7.5-325 MG PO TABS: 1.0000 | ORAL_TABLET | Freq: Four times a day (QID) | ORAL | Status: AC | PRN

## 2014-09-08 MED ORDER — TRAMADOL HCL 50 MG PO TABS: 100.0000 mg | ORAL_TABLET | Freq: Four times a day (QID) | ORAL | Status: AC | PRN

## 2014-09-08 NOTE — Telephone Encounter (Signed)
Pt requests pain med refill.

## 2016-06-24 ENCOUNTER — Encounter: Payer: Self-pay | Admitting: Internal Medicine

## 2016-07-15 ENCOUNTER — Ambulatory Visit: Payer: Medicare HMO | Admitting: Internal Medicine
